# Patient Record
Sex: Female | Born: 1995 | Race: White | Hispanic: No | Marital: Single | Smoking: Current every day smoker
Health system: Southern US, Community
[De-identification: ages and names within clinical notes are randomized; demographics above are authoritative.]

## PROBLEM LIST (undated history)

## (undated) DIAGNOSIS — N83209 Unspecified ovarian cyst, unspecified side: Secondary | ICD-10-CM

## (undated) DIAGNOSIS — R5600 Simple febrile convulsions: Secondary | ICD-10-CM

## (undated) DIAGNOSIS — N200 Calculus of kidney: Secondary | ICD-10-CM

## (undated) DIAGNOSIS — F419 Anxiety disorder, unspecified: Secondary | ICD-10-CM

## (undated) DIAGNOSIS — F32A Depression, unspecified: Secondary | ICD-10-CM

## (undated) DIAGNOSIS — Z87442 Personal history of urinary calculi: Secondary | ICD-10-CM

## (undated) DIAGNOSIS — F329 Major depressive disorder, single episode, unspecified: Secondary | ICD-10-CM

## (undated) DIAGNOSIS — D649 Anemia, unspecified: Secondary | ICD-10-CM

## (undated) HISTORY — DX: Major depressive disorder, single episode, unspecified: F32.9

## (undated) HISTORY — PX: KNEE SURGERY: SHX244

## (undated) HISTORY — DX: Calculus of kidney: N20.0

## (undated) HISTORY — DX: Depression, unspecified: F32.A

---
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
## 2016-06-27 ENCOUNTER — Encounter: Payer: Self-pay | Admitting: Emergency Medicine

## 2016-06-27 ENCOUNTER — Emergency Department: Payer: BLUE CROSS/BLUE SHIELD

## 2016-06-27 ENCOUNTER — Emergency Department
Admission: EM | Admit: 2016-06-27 | Discharge: 2016-06-27 | Disposition: A | Discharge status: Home / Self Care | Payer: BLUE CROSS/BLUE SHIELD | Attending: Emergency Medicine | Admitting: Emergency Medicine

## 2016-06-27 DIAGNOSIS — N730 Acute parametritis and pelvic cellulitis: Secondary | ICD-10-CM | POA: Diagnosis not present

## 2016-06-27 DIAGNOSIS — A599 Trichomoniasis, unspecified: Secondary | ICD-10-CM

## 2016-06-27 DIAGNOSIS — A749 Chlamydial infection, unspecified: Secondary | ICD-10-CM | POA: Insufficient documentation

## 2016-06-27 DIAGNOSIS — R112 Nausea with vomiting, unspecified: Secondary | ICD-10-CM | POA: Diagnosis present

## 2016-06-27 DIAGNOSIS — R109 Unspecified abdominal pain: Secondary | ICD-10-CM

## 2016-06-27 LAB — COMPREHENSIVE METABOLIC PANEL
ALT: 17 U/L (ref 14–54)
AST: 22 U/L (ref 15–41)
Albumin: 4.1 g/dL (ref 3.5–5.0)
Alkaline Phosphatase: 94 U/L (ref 38–126)
Anion gap: 6 (ref 5–15)
BUN: 13 mg/dL (ref 6–20)
CO2: 28 mmol/L (ref 22–32)
Calcium: 9.3 mg/dL (ref 8.9–10.3)
Chloride: 106 mmol/L (ref 101–111)
Creatinine, Ser: 0.67 mg/dL (ref 0.44–1.00)
GFR calc Af Amer: >60 mL/min (ref >60)
GFR calc non Af Amer: >60 mL/min (ref >60)
Glucose, Bld: 110 mg/dL — ABNORMAL HIGH (ref 65–99)
Potassium: 4.3 mmol/L (ref 3.5–5.1)
Sodium: 140 mmol/L (ref 135–145)
Total Bilirubin: 0.2 mg/dL — ABNORMAL LOW (ref 0.3–1.2)
Total Protein: 7.3 g/dL (ref 6.5–8.1)

## 2016-06-27 LAB — URINALYSIS, COMPLETE (UACMP) WITH MICROSCOPIC
Bacteria, UA: NONE SEEN
Bilirubin Urine: NEGATIVE
Glucose, UA: NEGATIVE mg/dL
Hgb urine dipstick: NEGATIVE
Ketones, ur: NEGATIVE mg/dL
Nitrite: NEGATIVE
Protein, ur: NEGATIVE mg/dL
Specific Gravity, Urine: 1.021 (ref 1.005–1.030)
pH: 6 (ref 5.0–8.0)

## 2016-06-27 LAB — CBC
HCT: 38.9 % (ref 35.0–47.0)
Hemoglobin: 12.6 g/dL (ref 12.0–16.0)
MCH: 26.3 pg (ref 26.0–34.0)
MCHC: 32.3 g/dL (ref 32.0–36.0)
MCV: 81.3 fL (ref 80.0–100.0)
Platelets: 273 10*3/uL (ref 150–440)
RBC: 4.79 MIL/uL (ref 3.80–5.20)
RDW: 15.2 % — ABNORMAL HIGH (ref 11.5–14.5)
WBC: 11.8 10*3/uL — ABNORMAL HIGH (ref 3.6–11.0)

## 2016-06-27 LAB — WET PREP, GENITAL
Clue Cells Wet Prep HPF POC: NONE SEEN
Sperm: NONE SEEN
Yeast Wet Prep HPF POC: NONE SEEN

## 2016-06-27 LAB — CHLAMYDIA/NGC RT PCR (ARMC ONLY)
Chlamydia Tr: DETECTED — AB
N gonorrhoeae: NOT DETECTED

## 2016-06-27 LAB — POCT PREGNANCY, URINE: Preg Test, Ur: NEGATIVE

## 2016-06-27 LAB — LIPASE, BLOOD: Lipase: 46 U/L (ref 11–51)

## 2016-06-27 LAB — TROPONIN I: Troponin I: <0.03 ng/mL (ref <0.03)

## 2016-06-27 MED ORDER — ONDANSETRON HCL 4 MG/2ML IJ SOLN
4.0000 mg | Freq: Once | INTRAMUSCULAR | Status: AC
  Administered 2016-06-27: 4 mg via INTRAVENOUS
  Filled 2016-06-27: qty 2

## 2016-06-27 MED ORDER — IBUPROFEN 600 MG PO TABS
600.0000 mg | ORAL_TABLET | Freq: Once | ORAL | Status: AC
  Administered 2016-06-27: 600 mg via ORAL

## 2016-06-27 MED ORDER — DOXYCYCLINE HYCLATE 50 MG PO CAPS: 100.0000 mg | ORAL_CAPSULE | Freq: Two times a day (BID) | ORAL | 0 refills | Status: DC

## 2016-06-27 MED ORDER — SODIUM CHLORIDE 0.9 % IV BOLUS (SEPSIS)
1000.0000 mL | Freq: Once | INTRAVENOUS | Status: AC
  Administered 2016-06-27: 1000 mL via INTRAVENOUS

## 2016-06-27 MED ORDER — IOPAMIDOL (ISOVUE-300) INJECTION 61%
100.0000 mL | Freq: Once | INTRAVENOUS | Status: AC | PRN
  Administered 2016-06-27: 100 mL via INTRAVENOUS

## 2016-06-27 MED ORDER — METRONIDAZOLE 500 MG PO TABS: 500.0000 mg | ORAL_TABLET | Freq: Two times a day (BID) | ORAL | 0 refills | Status: DC

## 2016-06-27 MED ORDER — MORPHINE SULFATE (PF) 4 MG/ML IV SOLN
4.0000 mg | Freq: Once | INTRAVENOUS | Status: AC
  Administered 2016-06-27: 4 mg via INTRAVENOUS
  Filled 2016-06-27: qty 1

## 2016-06-27 MED ORDER — DOXYCYCLINE HYCLATE 100 MG PO TABS
100.0000 mg | ORAL_TABLET | Freq: Two times a day (BID) | ORAL | Status: DC
  Administered 2016-06-27: 100 mg via ORAL
  Filled 2016-06-27: qty 1

## 2016-06-27 MED ORDER — METRONIDAZOLE 500 MG PO TABS
500.0000 mg | ORAL_TABLET | Freq: Once | ORAL | Status: AC
  Administered 2016-06-27: 500 mg via ORAL
  Filled 2016-06-27: qty 1

## 2016-06-27 MED ORDER — IOPAMIDOL (ISOVUE-300) INJECTION 61%
30.0000 mL | Freq: Once | INTRAVENOUS | Status: AC | PRN
  Administered 2016-06-27: 30 mL via ORAL

## 2016-06-27 MED ORDER — IBUPROFEN 600 MG PO TABS
ORAL_TABLET | ORAL | Status: AC
  Administered 2016-06-27: 600 mg via ORAL
  Filled 2016-06-27: qty 1

## 2016-06-27 NOTE — ED Notes (Signed)
Patient transported to CT 

## 2016-06-27 NOTE — Discharge Instructions (Signed)
You were evaluated for abdominal pain and found to have STD called Chlamydia and STD called trichomonas and so I'm treating U for a longer period of time for what is called pelvic inflammatory disease.  Also found to have a cyst on your right ovary. Please follow-up with OB/GYN if you're having any continued symptoms.  Return to the emergency department immediately for any worsening abdominal pain, fever, vomiting and cannot keep her medications down, or any other symptoms concerning to you. Next I'm pleased or any sexual contacts to be tested and treated to prevent spread or re-exposure to STD.

## 2016-06-27 NOTE — ED Notes (Signed)
Pt awakened from sleep in lobby when name called standing right beside her; pt rates abd pain 7/10;

## 2016-06-27 NOTE — ED Provider Notes (Addendum)
[facility] Emergency Department Provider Note ____________________________________________   I have reviewed the triage vital signs and the triage nursing note.  HISTORY  Chief Complaint Abdominal Pain and Emesis   Historian Patient  HPI Vanessa Hester is a 21 y.o. female woke up around midnight with mid abdominal pain. She became nauseated and has vomited once in the emergency department waiting room, reports nonbloody nonbilious. Pain is been persistent and is somewhat crampy in nature. No diarrhea. Pain now is more so on the right side and right lower quadrant.  Pain is moderate to severe.  Mild vaginal discharge without vaginal or pelvic pain.    Past Medical History:  Diagnosis Date  . Depression   . Febrile seizure (HCC) 10/10/14   When she was an infany    Patient Active Problem List   Diagnosis Date Noted  . Anemia 02/05/2015  . Depression 02/05/2015  . Menorrhagia 02/04/2015  . Fatigue 02/04/2015  . Tension headache 02/04/2015  . IBS (irritable bowel syndrome) 02/04/2015  . Vitamin D deficiency 02/04/2015    Past Surgical History:  Procedure Laterality Date  . KNEE SURGERY Left   . 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     Prior to Admission medications   Medication Sig Start Date End Date Taking? Authorizing Provider  azithromycin (ZITHROMAX Z-PAK) 250 MG tablet Take 2 tablets (500 mg) on  Day 1,  followed by 1 tablet (250 mg) once daily on Days 2 through 5. 06/21/15   Ignacia Bayley, PA-C  ciprofloxacin (CIPRO) 500 MG tablet Take 1 tablet (500 mg total) by mouth 2 (two) times daily. 12/26/15   Jene Every, MD  cyclobenzaprine (FLEXERIL) 5 MG tablet Take 1 tablet (5 mg total) by mouth every 8 (eight) hours as needed for muscle spasms. 10/22/15 10/21/16  Charlynne Pander, MD  doxycycline (VIBRAMYCIN) 50 MG capsule Take 2 capsules (100 mg total) by mouth 2 (two) times daily. 06/27/16   Governor Rooks, MD   HYDROcodone-acetaminophen (NORCO) 5-325 MG tablet Take 1-2 tablets by mouth every 6 (six) hours as needed for moderate pain. 10/22/15   Charlynne Pander, MD  HYDROcodone-homatropine [facility]) 5-1.5 MG/5ML syrup Take 5 mLs by mouth every 6 (six) hours as needed for cough. 06/21/15   Ignacia Bayley, PA-C  Linaclotide (LINZESS) 145 MCG CAPS capsule Take 1 capsule (145 mcg total) by mouth daily. Failed Miralax 02/05/15   Gabriel Cirri, NP  metroNIDAZOLE (FLAGYL) 500 MG tablet Take 1 tablet (500 mg total) by mouth 2 (two) times daily. 06/27/16   Governor Rooks, MD  norgestimate-ethinyl estradiol (SPRINTEC 28) 0.25-35 MG-MCG tablet Take 1 tablet by mouth daily. 02/05/15   Gabriel Cirri, NP  ondansetron (ZOFRAN ODT) 4 MG disintegrating tablet Take 1 tablet (4 mg total) by mouth every 8 (eight) hours as needed for nausea or vomiting. 04/21/15   Jene Every, MD    Allergies  Allergen Reactions  . Other Shortness Of Breath    Medication is "Durahistamine" used for coughing when she was a child  . Gardasil 9 [Hpv 9-Valent Recomb Vaccine]   . Amoxicillin Hives    Family History  Problem Relation Age of Onset  . Migraines Father   . Hypertension Father   . Cancer Maternal Grandmother     colon and uterine  . Cancer Maternal Grandfather     prostate  . Arthritis Paternal Grandmother   . Diabetes Paternal Grandmother   . Asthma Paternal Grandfather   .  Depression Mother   . Cancer Maternal Aunt     breast  . Diabetes Maternal Uncle   . Mental illness Paternal Aunt     Social History Social History  Substance Use Topics  . Smoking status: Never Smoker  . Smokeless tobacco: Never Used  . Alcohol use No    Review of Systems  Constitutional: Negative for fever. Eyes: Negative for visual changes. ENT: Negative for sore throat. Cardiovascular: Negative for chest pain. Respiratory: Negative for shortness of breath. Gastrointestinal: Negative for Constipation or diarrhea. Genitourinary: Negative  for dysuria. Musculoskeletal: Negative for back pain. Skin: Negative for rash. Neurological: Negative for headache. 10 point Review of Systems otherwise negative ____________________________________________   PHYSICAL EXAM:  VITAL SIGNS: ED Triage Vitals  Enc Vitals Group     BP 06/27/16 0700 (!) 108/58     Pulse Rate 06/27/16 0700 (!) 106     Resp 06/27/16 0700 18     Temp 06/27/16 0700 97.7 F (36.5 C)     Temp Source 06/27/16 0700 Oral     SpO2 06/27/16 0700 98 %     Weight 06/27/16 0255 210 lb (95.3 kg)     Height 06/27/16 0255 5\' 5"  (1.651 m)     Head Circumference --      Peak Flow --      Pain Score 06/27/16 0256 8     Pain Loc --      Pain Edu? --      Excl. in GC? --      Constitutional: Alert and oriented. Well appearing and in no distress. HEENT   Head: Normocephalic and atraumatic.      Eyes: Conjunctivae are normal. PERRL. Normal extraocular movements.      Ears:         Nose: No congestion/rhinnorhea.   Mouth/Throat: Mucous membranes are moist.   Neck: No stridor. Cardiovascular/Chest: Normal rate, regular rhythm.  No murmurs, rubs, or gallops. Respiratory: Normal respiratory effort without tachypnea nor retractions. Breath sounds are clear and equal bilaterally. No wheezes/rales/rhonchi. Gastrointestinal: Soft. No distention, no guarding, no rebound.  Obese. Moderate tenderness epigastric and right upper quadrant and right lower quadrant.  Genitourinary/rectal:Fair amount of vaginal discharge, no cervicitis or specific adnexal or uterine tenderness to palpation. Musculoskeletal: Nontender with normal range of motion in all extremities. No joint effusions.  No lower extremity tenderness.  No edema. Neurologic:  Normal speech and language. No gross or focal neurologic deficits are appreciated. Skin:  Skin is warm, dry and intact. No rash noted. Psychiatric: Mood and affect are normal. Speech and behavior are normal. Patient exhibits appropriate  insight and judgment.   ____________________________________________  LABS (pertinent positives/negatives)  Labs Reviewed  CHLAMYDIA/NGC RT PCR ([facility] ONLY) - Abnormal; Notable for the following:       Result Value   Chlamydia Tr DETECTED (*)    All other components within normal limits  WET PREP, GENITAL - Abnormal; Notable for the following:    Trich, Wet Prep PRESENT (*)    WBC, Wet Prep HPF POC MODERATE (*)    All other components within normal limits  COMPREHENSIVE METABOLIC PANEL - Abnormal; Notable for the following:    Glucose, Bld 110 (*)    Total Bilirubin 0.2 (*)    All other components within normal limits  CBC - Abnormal; Notable for the following:    WBC 11.8 (*)    RDW 15.2 (*)    All other components within normal limits  URINALYSIS, COMPLETE (UACMP)  WITH MICROSCOPIC - Abnormal; Notable for the following:    Color, Urine YELLOW (*)    APPearance CLEAR (*)    Leukocytes, UA TRACE (*)    Squamous Epithelial / LPF 0-5 (*)    All other components within normal limits  LIPASE, BLOOD  TROPONIN I  POC URINE PREG, ED  POCT PREGNANCY, URINE    ____________________________________________    EKG I, Governor Rooks, MD, the attending physician have personally viewed and interpreted all ECGs.  81 bpm. Normal sinus rhythm. No distress. Normal axis. Normal ST and T-wave ____________________________________________  RADIOLOGY All Xrays were viewed by me. Imaging interpreted by Radiologist.  CT abdomen and pelvis with contrast:  IMPRESSION: Normal appendix.  4 cm right ovarian cyst.  Pelvic and transvaginal ultrasound: IMPRESSION: 3.5 cm complex cyst in the right ovary most compatible with hemorrhagic cyst.  No evidence of torsion. __________________________________________  PROCEDURES  Procedure(s) performed: None  Critical Care performed: None  ____________________________________________   ED COURSE / ASSESSMENT AND PLAN  Pertinent  labs & imaging results that were available during my care of the patient were reviewed by me and considered in my medical decision making (see chart for details).   Ms. Mangiapane is here with a complaint of abdominal pain, and initially was associated with nausea and vomiting and mid umbilical pain, now on exam seems more right sided including right lower quadrant. I discussed procedures benefit for CT scan and we chose to proceed.   CT scan is reassuring with no sign of appendicitis. She does have a right-sided ovarian cyst. After completing the pelvic exam, she did have a fair amount of discharge, and STD testing came back positive for Chlamydia and Trichomonas. I discussed this with her as well as treating and testing any sexual partners. Given the fact that her white blood cell count is little bit elevated and she has this cystic structure seen on the ovary, I did discuss with her obtaining an ultrasound to rule out tubo-ovarian abscess although I suspect this is extremely unlikely.  I'm going to go ahead and treat her for 10 days of doxycycline and metronidazole for PID.  Ultrasound negative for torsion or TOA. Patient to be discharged with my prepared discharge instructions.    CONSULTATIONS:   None  Patient / Family / Caregiver informed of clinical course, medical decision-making process, and agree with plan.   I discussed return precautions, follow-up instructions, and discharge instructions with patient and/or family.   ___________________________________________   FINAL CLINICAL IMPRESSION(S) / ED DIAGNOSES   Final diagnoses:  Nausea and vomiting, intractability of vomiting not specified, unspecified vomiting type  Right sided abdominal pain  PID (acute pelvic inflammatory disease)  Trichomonas infection  Chlamydia              Note: This dictation was prepared with Dragon dictation. Any transcriptional errors that result from this process are unintentional     Governor Rooks, MD 06/27/16 1517    Governor Rooks, MD 06/27/16 1525

## 2016-06-27 NOTE — ED Triage Notes (Signed)
Patient with complaint of lower abdominal pain that started around midnight. Patient states that vomited times two. Patient denies diarrhea.

## 2016-06-27 NOTE — ED Notes (Signed)
MD at bedside. 

## 2016-06-28 ENCOUNTER — Encounter (HOSPITAL_COMMUNITY): Payer: Self-pay | Admitting: Certified Nurse Midwife

## 2016-06-28 ENCOUNTER — Inpatient Hospital Stay (HOSPITAL_COMMUNITY)
Admission: AD | Admit: 2016-06-28 | Discharge: 2016-06-28 | Disposition: A | Discharge status: Home / Self Care | Payer: BLUE CROSS/BLUE SHIELD | Source: Ambulatory Visit | Attending: Family Medicine | Admitting: Family Medicine

## 2016-06-28 ENCOUNTER — Telehealth (HOSPITAL_COMMUNITY): Payer: Self-pay

## 2016-06-28 DIAGNOSIS — N83201 Unspecified ovarian cyst, right side: Secondary | ICD-10-CM

## 2016-06-28 DIAGNOSIS — N8301 Follicular cyst of right ovary: Secondary | ICD-10-CM

## 2016-06-28 DIAGNOSIS — A749 Chlamydial infection, unspecified: Secondary | ICD-10-CM

## 2016-06-28 DIAGNOSIS — A599 Trichomoniasis, unspecified: Secondary | ICD-10-CM

## 2016-06-28 DIAGNOSIS — R109 Unspecified abdominal pain: Secondary | ICD-10-CM | POA: Diagnosis present

## 2016-06-28 LAB — CBC
HCT: 36.7 % (ref 36.0–46.0)
Hemoglobin: 11.9 g/dL — ABNORMAL LOW (ref 12.0–15.0)
MCH: 26.5 pg (ref 26.0–34.0)
MCHC: 32.4 g/dL (ref 30.0–36.0)
MCV: 81.7 fL (ref 78.0–100.0)
Platelets: 239 10*3/uL (ref 150–400)
RBC: 4.49 MIL/uL (ref 3.87–5.11)
RDW: 15 % (ref 11.5–15.5)
WBC: 10.2 10*3/uL (ref 4.0–10.5)

## 2016-06-28 LAB — URINALYSIS, ROUTINE W REFLEX MICROSCOPIC
Bilirubin Urine: NEGATIVE
Glucose, UA: NEGATIVE mg/dL
Hgb urine dipstick: NEGATIVE
Ketones, ur: NEGATIVE mg/dL
Leukocytes, UA: NEGATIVE
Nitrite: NEGATIVE
Protein, ur: NEGATIVE mg/dL
Specific Gravity, Urine: 1.027 (ref 1.005–1.030)
pH: 5 (ref 5.0–8.0)

## 2016-06-28 LAB — COMPREHENSIVE METABOLIC PANEL
ALT: 16 U/L (ref 14–54)
AST: 19 U/L (ref 15–41)
Albumin: 3.4 g/dL — ABNORMAL LOW (ref 3.5–5.0)
Alkaline Phosphatase: 69 U/L (ref 38–126)
Anion gap: 5 (ref 5–15)
BUN: 11 mg/dL (ref 6–20)
CO2: 28 mmol/L (ref 22–32)
Calcium: 8.9 mg/dL (ref 8.9–10.3)
Chloride: 104 mmol/L (ref 101–111)
Creatinine, Ser: 0.72 mg/dL (ref 0.44–1.00)
GFR calc Af Amer: >60 mL/min (ref >60)
GFR calc non Af Amer: >60 mL/min (ref >60)
Glucose, Bld: 94 mg/dL (ref 65–99)
Potassium: 4.9 mmol/L (ref 3.5–5.1)
Sodium: 137 mmol/L (ref 135–145)
Total Bilirubin: 0.5 mg/dL (ref 0.3–1.2)
Total Protein: 6.1 g/dL — ABNORMAL LOW (ref 6.5–8.1)

## 2016-06-28 LAB — POCT PREGNANCY, URINE: Preg Test, Ur: NEGATIVE

## 2016-06-28 MED ORDER — OXYCODONE-ACETAMINOPHEN 5-325 MG PO TABS
2.0000 | ORAL_TABLET | Freq: Once | ORAL | Status: AC
  Administered 2016-06-28: 2 via ORAL
  Filled 2016-06-28: qty 2

## 2016-06-28 MED ORDER — OXYCODONE-ACETAMINOPHEN 5-325 MG PO TABS: 1.0000 | ORAL_TABLET | Freq: Four times a day (QID) | ORAL | 0 refills | Status: DC | PRN

## 2016-06-28 MED ORDER — PROMETHAZINE HCL 25 MG PO TABS: 12.5000 mg | ORAL_TABLET | Freq: Four times a day (QID) | ORAL | 0 refills | Status: DC | PRN

## 2016-06-28 MED ORDER — METRONIDAZOLE 500 MG PO TABS
2000.0000 mg | ORAL_TABLET | Freq: Once | ORAL | Status: AC
  Administered 2016-06-28: 2000 mg via ORAL
  Filled 2016-06-28: qty 4

## 2016-06-28 MED ORDER — AZITHROMYCIN 250 MG PO TABS
1000.0000 mg | ORAL_TABLET | Freq: Once | ORAL | Status: AC
  Administered 2016-06-28: 1000 mg via ORAL
  Filled 2016-06-28: qty 4

## 2016-06-28 MED ORDER — ONDANSETRON 8 MG PO TBDP
8.0000 mg | ORAL_TABLET | ORAL | Status: AC
  Administered 2016-06-28: 8 mg via ORAL
  Filled 2016-06-28: qty 1

## 2016-06-28 MED ORDER — KETOROLAC TROMETHAMINE 60 MG/2ML IM SOLN
60.0000 mg | Freq: Once | INTRAMUSCULAR | Status: AC
  Administered 2016-06-28: 60 mg via INTRAMUSCULAR
  Filled 2016-06-28: qty 2

## 2016-06-28 NOTE — Telephone Encounter (Signed)
Pt called asking about a rx that was given last night at her visit. Her Pharmacy told her she would have to have a hand written rx for the one med she was not able to pick up at pharmacy. Pt coming later tonight to pick up rx.

## 2016-06-28 NOTE — Discharge Instructions (Signed)
Expedited Partner Therapy:  °Information Sheet for Patients and Partners  °            ° °You have been offered expedited partner therapy (EPT). This information sheet contains important information and warnings you need to be aware of, so please read it carefully.  ° °Expedited Partner Therapy (EPT) is the clinical practice of treating the sexual partners of persons who receive chlamydia, gonorrhea, or trichomoniasis diagnoses by providing medications or prescriptions to the patient. Patients then provide partners with these therapies without the health-care provider having examined the partner. In other words, EPT is a convenient, fast and private way for patients to help their sexual partners get treated.  ° °Chlamydia and gonorrhea are bacterial infections you get from having sex with a person who is already infected. Trichomoniasis (or “trich”) is a very common sexually transmitted infection (STI) that is caused by infection with a protozoan parasite called Trichomonas vaginalis.  Many people with these infections don’t know it because they feel fine, but without treatment these infections can cause serious health problems, such as pelvic inflammatory disease, ectopic pregnancy, infertility and increased risk of HIV.  ° °It is important to get treated as soon as possible to protect your health, to avoid spreading these infections to others, and to prevent yourself from becoming re-infected. The good news is these infections can be easily cured with proper antibiotic medicine. The best way to take care of your self is to see a doctor or go to your local health department. If you are not able to see a doctor or other medical provider, you should take EPT.  ° ° °Recommended Medication: °EPT for Chlamydia:  Azithromycin (Zithromax) 1 gram orally in a single dose °EPT for Gonorrhea:  Cefixime (Suprax) 400 milligrams orally in a single dose PLUS azithromycin (Zithromax) 1 gram orally in a single dose °EPT for  Trichomoniasis:  Metronidazole (Flagyl) 2 grams orally in a single dose ° ° °These medicines are very safe. However, you should not take them if you have ever had an allergic reaction (like a rash) to any of these medicines: azithromycin (Zithromax), erythromycin, clarithromycin (Biaxin), metronidazole (Flagyl), tinidazole (Tindimax). If you are uncertain about whether you have an allergy, call your medical provider or pharmacist before taking this medicine. If you have a serious, long-term illness like kidney, liver or heart disease, colitis or stomach problems, or you are currently taking other prescription medication, talk to your provider before taking this medication.  ° °Women: If you have lower belly pain, pain during sex, vomiting, or a fever, do not take this medicine. Instead, you should see a medical provider to be certain you do not have pelvic inflammatory disease (PID). PID can be serious and lead to infertility, pregnancy problems or chronic pelvic pain.  ° °Pregnant Women: It is very important for you to see a doctor to get pregnancy services and pre-natal care. These antibiotics for EPT are safe for pregnant women, but you still need to see a medical provider as soon as possible. It is also important to note that Doxycycline is an alternative therapy for chlamydia, but it should not be taken by someone who is pregnant.  ° °Men: If you have pain or swelling in the testicles or a fever, do not take this medicine and see a medical provider.    ° °Men who have sex with men (MSM): MSM in [place] continue to experience high rates of syphilis and HIV. Many MSM with gonorrhea or   chlamydia could also have syphilis and/or HIV and not know it. If you are a man who has sex with other men, it is very important that you see a medical provider and are tested for HIV and syphilis. EPT is not recommended for gonorrhea for MSM.  Recommended treatment for gonorrhea for MSM is Rocephin (shot) AND azithromycin  due to decreased cure rate.  Please see your medical provider if this is the case.   ° °Along with this information sheet is a prescription for the medicine. If you receive a prescription it will be in your name and will indicate your date of birth, or it will be in the name of “Expedited Partner Therapy”.   In either case, you can have the prescription filled at a pharmacy. You will be responsible for the cost of the medicine, unless you have prescription drug coverage. In that case, you could provide your name so the pharmacy could bill your health plan.  ° °Take the medication as directed. Some people will have a mild, upset stomach, which does not last long. AVOID alcohol 24 hours after taking metronidazole (Flagyl) to reduce the possibility of a disulfiram-like reaction (severe vomiting and abdominal pain).  After taking the medicine, do not have sex for 7 days. Do not share this medicine or give it to anyone else. It is important to tell everyone you have had sex with in the last 60 days that they need to go and get tested for sexually transmitted infections.  ° °Ways to prevent these and other sexually transmitted infections (STIs):  ° °• Abstain from sex. This is the only sure way to avoid getting an STI.  °• Use barrier methods, such as condoms, consistently and correctly.  °• Limit the number of sexual partners.  °• Have regular physical exams, including testing for STIs.  ° °For more information about EPT or other issues pertaining to an STI, please contact your medical provider or the [place] [facility] at [phone] or [email or web address]   ° °

## 2016-06-28 NOTE — MAU Note (Signed)
Pt was seen at [facility] last night-told she has a hemorrhagic cyst on right ovary. States that she has continued to have nausea and abdominal pain. Was given 800mg  ibuprofen around 300pm. Took Tylenol around 8pm. Has not helped. Rates 10/10.

## 2016-06-28 NOTE — MAU Provider Note (Signed)
Chief Complaint: Abdominal Pain and Nausea   None     SUBJECTIVE HPI: Vanessa Hester is a 21 y.o. G0 who presents to maternity admissions reporting abdominal pain and nausea x 3 days.  This is a new symptom and the pt was first evaluated yesterday at [facility], where she was diagnosed with hemorrhagic ovarian cyst on the right.  She was given morphine in the ED but reports it made her more nauseous and the pain returned. She has tried ibuprofen and tylenol and a heating pad at home but the pain has continued. It has not improved since yesterday but has not worsened either.  It is constant low abdominal pain, mostly on the right side but also low in the middle that is burning and stabbing pain.  It is associated with nausea but no vomiting.  She also reports vaginal spotting. She had PCR testing for chlamydia that was positive yesterday and wet prep was positive for trichomonas.  She was prescribed doxycycline and Flagyl for these infections. She denies vaginal bleeding, vaginal itching/burning, urinary symptoms, h/a, dizziness, n/v, or fever/chills.     HPI  Past Medical History:  Diagnosis Date  . Depression   . Febrile seizure (HCC) 10/10/14   When she was an infany   Past Surgical History:  Procedure Laterality Date  . KNEE SURGERY Left   . 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    Social History   Social History  . Marital status: Single    Spouse name: N/A  . Number of children: N/A  . Years of education: N/A   Occupational History  . Not on file.   Social History Main Topics  . Smoking status: Never Smoker  . Smokeless tobacco: Never Used  . Alcohol use No  . Drug use: No  . Sexual activity: Not on file   Other Topics Concern  . Not on file   Social History Narrative  . No narrative on file   No current facility-administered medications on file prior to encounter.    Current Outpatient Prescriptions on File Prior to Encounter   Medication Sig Dispense Refill  . Linaclotide (LINZESS) 145 MCG CAPS capsule Take 1 capsule (145 mcg total) by mouth daily. Failed Miralax 30 capsule 11   Allergies  Allergen Reactions  . Other Shortness Of Breath    Medication is "Durahistamine" used for coughing when she was a child  . Gardasil 9 [Hpv 9-Valent Recomb Vaccine]   . Amoxicillin Hives    ROS:  Review of Systems  Constitutional: Negative for chills, fatigue and fever.  Respiratory: Negative for shortness of breath.   Cardiovascular: Negative for chest pain.  Gastrointestinal: Positive for abdominal pain.  Genitourinary: Positive for pelvic pain and vaginal bleeding. Negative for difficulty urinating, dysuria, flank pain, vaginal discharge and vaginal pain.  Neurological: Negative for dizziness and headaches.  Psychiatric/Behavioral: Negative.      I have reviewed patient's Past Medical Hx, Surgical Hx, Family Hx, Social Hx, medications and allergies.   Physical Exam   Patient Vitals for the past 24 hrs:  BP Temp Temp src Pulse Resp SpO2  06/28/16 0257 130/75 98 F (36.7 C) Oral 74 16 -  06/28/16 0044 122/70 97.8 F (36.6 C) Oral 76 18 100 %   Constitutional: Well-developed, well-nourished female in mild distress.  Cardiovascular: normal rate Respiratory: normal effort GI: Abd soft, non-tender. Pos BS x 4 MS: Extremities nontender, no edema, normal ROM Neurologic: Alert and  oriented x 4.  GU: Neg CVAT.  PELVIC EXAM: Deferred, done yesterday in ED   LAB RESULTS Results for orders placed or performed during the hospital encounter of 06/28/16 (from the past 24 hour(s))  Urinalysis, Routine w reflex microscopic     Status: None   Collection Time: 06/28/16 12:26 AM  Result Value Ref Range   Color, Urine YELLOW YELLOW   APPearance CLEAR CLEAR   Specific Gravity, Urine 1.027 1.005 - 1.030   pH 5.0 5.0 - 8.0   Glucose, UA NEGATIVE NEGATIVE mg/dL   Hgb urine dipstick NEGATIVE NEGATIVE   Bilirubin Urine  NEGATIVE NEGATIVE   Ketones, ur NEGATIVE NEGATIVE mg/dL   Protein, ur NEGATIVE NEGATIVE mg/dL   Nitrite NEGATIVE NEGATIVE   Leukocytes, UA NEGATIVE NEGATIVE  CBC     Status: Abnormal   Collection Time: 06/28/16  1:33 AM  Result Value Ref Range   WBC 10.2 4.0 - 10.5 K/uL   RBC 4.49 3.87 - 5.11 MIL/uL   Hemoglobin 11.9 (L) 12.0 - 15.0 g/dL   HCT 16.1 09.6 - 04.5 %   MCV 81.7 78.0 - 100.0 fL   MCH 26.5 26.0 - 34.0 pg   MCHC 32.4 30.0 - 36.0 g/dL   RDW 40.9 81.1 - 91.4 %   Platelets 239 150 - 400 K/uL  Comprehensive metabolic panel     Status: Abnormal   Collection Time: 06/28/16  1:33 AM  Result Value Ref Range   Sodium 137 135 - 145 mmol/L   Potassium 4.9 3.5 - 5.1 mmol/L   Chloride 104 101 - 111 mmol/L   CO2 28 22 - 32 mmol/L   Glucose, Bld 94 65 - 99 mg/dL   BUN 11 6 - 20 mg/dL   Creatinine, Ser 7.82 0.44 - 1.00 mg/dL   Calcium 8.9 8.9 - 95.6 mg/dL   Total Protein 6.1 (L) 6.5 - 8.1 g/dL   Albumin 3.4 (L) 3.5 - 5.0 g/dL   AST 19 15 - 41 U/L   ALT 16 14 - 54 U/L   Alkaline Phosphatase 69 38 - 126 U/L   Total Bilirubin 0.5 0.3 - 1.2 mg/dL   GFR calc non Af Amer >60 >60 mL/min   GFR calc Af Amer >60 >60 mL/min   Anion gap 5 5 - 15  Pregnancy, urine POC     Status: None   Collection Time: 06/28/16  1:48 AM  Result Value Ref Range   Preg Test, Ur NEGATIVE NEGATIVE       IMAGING US Transvaginal Non-ob  Result Date: 06/27/2016 CLINICAL DATA:  Right lower quadrant pain.  Right ovarian cyst on CT EXAM: TRANSABDOMINAL AND TRANSVAGINAL ULTRASOUND OF PELVIS DOPPLER ULTRASOUND OF OVARIES TECHNIQUE: Both transabdominal and transvaginal ultrasound examinations of the pelvis were performed. Transabdominal technique was performed for global imaging of the pelvis including uterus, ovaries, adnexal regions, and pelvic cul-de-sac. It was necessary to proceed with endovaginal exam following the transabdominal exam to visualize the endometrium and ovaries. Color and duplex Doppler ultrasound  was utilized to evaluate blood flow to the ovaries. COMPARISON:  CT 06/27/2016 FINDINGS: Uterus Measurements: 6.6 x 3.3 x 4.3 cm. No fibroids or other mass visualized. Endometrium Thickness: 11 mm in thickness.  No focal abnormality visualized. Right ovary Measurements: 4.6 x 3.6 x 3.7 cm. 3.5 x 3.0 x 2.7 cm complex cyst with internal echoes, likely hemorrhagic cyst. Left ovary Measurements: 3.4 x 2.2 x 2.8 cm. Normal appearance/no adnexal mass. Pulsed Doppler evaluation of both ovaries demonstrates  normal low-resistance arterial and venous waveforms. Other findings Trace free fluid IMPRESSION: 3.5 cm complex cyst in the right ovary most compatible with hemorrhagic cyst. No evidence of torsion. Electronically Signed   By: Charlett Nose M.D.   On: 06/27/2016 15:19   US Pelvis Complete  Result Date: 06/27/2016 CLINICAL DATA:  Right lower quadrant pain.  Right ovarian cyst on CT EXAM: TRANSABDOMINAL AND TRANSVAGINAL ULTRASOUND OF PELVIS DOPPLER ULTRASOUND OF OVARIES TECHNIQUE: Both transabdominal and transvaginal ultrasound examinations of the pelvis were performed. Transabdominal technique was performed for global imaging of the pelvis including uterus, ovaries, adnexal regions, and pelvic cul-de-sac. It was necessary to proceed with endovaginal exam following the transabdominal exam to visualize the endometrium and ovaries. Color and duplex Doppler ultrasound was utilized to evaluate blood flow to the ovaries. COMPARISON:  CT 06/27/2016 FINDINGS: Uterus Measurements: 6.6 x 3.3 x 4.3 cm. No fibroids or other mass visualized. Endometrium Thickness: 11 mm in thickness.  No focal abnormality visualized. Right ovary Measurements: 4.6 x 3.6 x 3.7 cm. 3.5 x 3.0 x 2.7 cm complex cyst with internal echoes, likely hemorrhagic cyst. Left ovary Measurements: 3.4 x 2.2 x 2.8 cm. Normal appearance/no adnexal mass. Pulsed Doppler evaluation of both ovaries demonstrates normal low-resistance arterial and venous waveforms. Other  findings Trace free fluid IMPRESSION: 3.5 cm complex cyst in the right ovary most compatible with hemorrhagic cyst. No evidence of torsion. Electronically Signed   By: Charlett Nose M.D.   On: 06/27/2016 15:19   Ct Abdomen Pelvis W Contrast  Result Date: 06/27/2016 CLINICAL DATA:  Right lower quadrant pain. EXAM: CT ABDOMEN AND PELVIS WITH CONTRAST TECHNIQUE: Multidetector CT imaging of the abdomen and pelvis was performed using the standard protocol following bolus administration of intravenous contrast. CONTRAST:  ISOVUE-300 IOPAMIDOL (ISOVUE-300) INJECTION 61%, 30mL ISOVUE-300 IOPAMIDOL (ISOVUE-300) INJECTION 61% COMPARISON:  10/22/2015 FINDINGS: Lower chest: Lung bases are clear. No effusions. Heart is normal size. Hepatobiliary: No focal hepatic abnormality. Gallbladder unremarkable. Pancreas: No focal abnormality or ductal dilatation. Spleen: No focal abnormality.  Normal size. 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 No renal or ureteral stone. No hydronephrosis. Adrenal glands and urinary bladder are unremarkable. Stomach/Bowel: Normal appendix. Stomach, large and small bowel grossly unremarkable. Vascular/Lymphatic: No evidence of aneurysm or adenopathy. Reproductive: 4 cm cyst in the right ovary. Uterus and left ovary unremarkable. Other: No free fluid or free air. Musculoskeletal: No acute bony abnormality. IMPRESSION: Normal appendix. 4 cm right ovarian cyst. Electronically Signed   By: Charlett Nose M.D.   On: 06/27/2016 11:59   [place] Art/ven Flow Abd Pelv Doppler  Result Date: 06/27/2016 CLINICAL DATA:  Right lower quadrant pain.  Right ovarian cyst on CT EXAM: TRANSABDOMINAL AND TRANSVAGINAL ULTRASOUND OF PELVIS DOPPLER ULTRASOUND OF OVARIES TECHNIQUE: Both transabdominal and transvaginal ultrasound examinations of the pelvis were performed. Transabdominal technique was performed for global imaging of the pelvis including uterus, ovaries,  adnexal regions, and pelvic cul-de-sac. It was necessary to proceed with endovaginal exam following the transabdominal exam to visualize the endometrium and ovaries. Color and duplex Doppler ultrasound was utilized to evaluate blood flow to the ovaries. COMPARISON:  CT 06/27/2016 FINDINGS: Uterus Measurements: 6.6 x 3.3 x 4.3 cm. No fibroids or other mass visualized. Endometrium Thickness: 11 mm in thickness.  No focal abnormality visualized. Right ovary Measurements: 4.6 x 3.6 x 3.7 cm. 3.5 x 3.0 x 2.7 cm complex cyst with internal echoes, likely hemorrhagic cyst. Left ovary Measurements: 3.4 x  2.2 x 2.8 cm. Normal appearance/no adnexal mass. Pulsed Doppler evaluation of both ovaries demonstrates normal low-resistance arterial and venous waveforms. Other findings Trace free fluid IMPRESSION: 3.5 cm complex cyst in the right ovary most compatible with hemorrhagic cyst. No evidence of torsion. Electronically Signed   By: Charlett Nose M.D.   On: 06/27/2016 15:19    MAU Management/MDM: Reviewed results from labs and imaging yesterday.  Repeat CBC/CMP today.  Will treat pain with Toradol 60 mg IM x 1 and Percocet 5/325 x 2 tabs in MAU.  Pt able to sleep and reports significantly reduced pain after medications.  Pt was prescribed 10 days of doxycycline and 7 days of Flagyl for chlamydia and trichomonas dx made yesterday.  She has not picked up those medications so single dose therapy with azithromycin 1000 mg and Flagyl 2 g given today in MAU for improved compliance.  Cancelled pt prescriptions for the other abx.  Rx for Percocet 5/325, take 1-2 Q 6 hours PRN x 10 tabs.  F/U at [facility], a convenient location for pt.  Pt stable at time of discharge.  ASSESSMENT 1. Hemorrhagic cyst of right ovary   2. Chlamydia   3. Trichomonas infection     PLAN Discharge home  Allergies as of 06/28/2016      Reactions   Other Shortness Of Breath   Medication is "Durahistamine" used for coughing when she was a child    Gardasil 9 [hpv 9-valent Recomb Vaccine]    Amoxicillin Hives      Medication List    STOP taking these medications   azithromycin 250 MG tablet Commonly known as:  ZITHROMAX Z-PAK   ciprofloxacin 500 MG tablet Commonly known as:  CIPRO   cyclobenzaprine 5 MG tablet Commonly known as:  FLEXERIL   doxycycline 50 MG capsule Commonly known as:  VIBRAMYCIN   HYDROcodone-acetaminophen 5-325 MG tablet Commonly known as:  NORCO   HYDROcodone-homatropine 5-1.5 MG/5ML syrup Commonly known as:  HYCODAN   metroNIDAZOLE 500 MG tablet Commonly known as:  FLAGYL   norgestimate-ethinyl estradiol 0.25-35 MG-MCG tablet Commonly known as:  SPRINTEC 28   ondansetron 4 MG disintegrating tablet Commonly known as:  ZOFRAN ODT     TAKE these medications   linaclotide 145 MCG Caps capsule Commonly known as:  LINZESS Take 1 capsule (145 mcg total) by mouth daily. Failed Miralax   oxyCODONE-acetaminophen 5-325 MG tablet Commonly known as:  PERCOCET/ROXICET Take 1-2 tablets by mouth every 6 (six) hours as needed.   promethazine 25 MG tablet Commonly known as:  PHENERGAN Take 0.5-1 tablets (12.5-25 mg total) by mouth every 6 (six) hours as needed for nausea.      Follow-up Information    [facility] Follow up.   Specialty:  Obstetrics and Gynecology Why:  The office will call you or call to make an appointment this week. Return to MAU as needed for emergencies. Contact information: [address] [place] [address] [phone]          Sharen Counter Certified Nurse-Midwife 06/28/2016  4:36 AM

## 2016-06-29 ENCOUNTER — Telehealth: Payer: Self-pay | Admitting: Radiology

## 2016-06-29 NOTE — Telephone Encounter (Signed)
Call to schedule appointment to f/u on cyst and birth control request,tried several times to contact pt via cell phone, but had no success. Recording states that the pt is not available at this time.

## 2016-06-30 ENCOUNTER — Telehealth: Payer: Self-pay | Admitting: Radiology

## 2016-06-30 NOTE — Telephone Encounter (Signed)
Called patient to schedule appointment and again the phone rang once and then same recording that the patient was unavailable at this time. So unable to contact patient or leave a message

## 2016-07-19 ENCOUNTER — Emergency Department
Admission: EM | Admit: 2016-07-19 | Discharge: 2016-07-19 | Disposition: A | Discharge status: Home / Self Care | Payer: BLUE CROSS/BLUE SHIELD | Attending: Emergency Medicine | Admitting: Emergency Medicine

## 2016-07-19 ENCOUNTER — Encounter: Payer: Self-pay | Admitting: Emergency Medicine

## 2016-07-19 DIAGNOSIS — R1032 Left lower quadrant pain: Secondary | ICD-10-CM | POA: Diagnosis not present

## 2016-07-19 DIAGNOSIS — Z5321 Procedure and treatment not carried out due to patient leaving prior to being seen by health care provider: Secondary | ICD-10-CM | POA: Insufficient documentation

## 2016-07-19 HISTORY — DX: Unspecified ovarian cyst, unspecified side: N83.209

## 2016-07-19 LAB — COMPREHENSIVE METABOLIC PANEL
ALT: 13 U/L — ABNORMAL LOW (ref 14–54)
AST: 21 U/L (ref 15–41)
Albumin: 4 g/dL (ref 3.5–5.0)
Alkaline Phosphatase: 79 U/L (ref 38–126)
Anion gap: 6 (ref 5–15)
BUN: 12 mg/dL (ref 6–20)
CO2: 26 mmol/L (ref 22–32)
Calcium: 9.2 mg/dL (ref 8.9–10.3)
Chloride: 106 mmol/L (ref 101–111)
Creatinine, Ser: 0.69 mg/dL (ref 0.44–1.00)
GFR calc Af Amer: >60 mL/min (ref >60)
GFR calc non Af Amer: >60 mL/min (ref >60)
Glucose, Bld: 113 mg/dL — ABNORMAL HIGH (ref 65–99)
Potassium: 3.6 mmol/L (ref 3.5–5.1)
Sodium: 138 mmol/L (ref 135–145)
Total Bilirubin: 0.4 mg/dL (ref 0.3–1.2)
Total Protein: 7 g/dL (ref 6.5–8.1)

## 2016-07-19 LAB — CBC
HCT: 38.7 % (ref 35.0–47.0)
Hemoglobin: 12.8 g/dL (ref 12.0–16.0)
MCH: 26.6 pg (ref 26.0–34.0)
MCHC: 33.1 g/dL (ref 32.0–36.0)
MCV: 80.4 fL (ref 80.0–100.0)
Platelets: 288 10*3/uL (ref 150–440)
RBC: 4.81 MIL/uL (ref 3.80–5.20)
RDW: 15.5 % — ABNORMAL HIGH (ref 11.5–14.5)
WBC: 9.2 10*3/uL (ref 3.6–11.0)

## 2016-07-19 LAB — LIPASE, BLOOD: Lipase: 26 U/L (ref 11–51)

## 2016-07-19 NOTE — ED Triage Notes (Signed)
Pt presents to ED with c/o sharp left lower abd pain. Similar symptoms about 3 weeks ago and pt was dx with STD and a "bleeding ovarian cyst". Pt states pain had improved but returned about 30 min ago. +nausea. Painful with palpation.

## 2016-07-21 ENCOUNTER — Encounter: Payer: Self-pay | Admitting: Family Medicine

## 2016-07-21 ENCOUNTER — Ambulatory Visit (INDEPENDENT_AMBULATORY_CARE_PROVIDER_SITE_OTHER): Payer: BLUE CROSS/BLUE SHIELD | Admitting: Family Medicine

## 2016-07-21 VITALS — BP 110/72 | HR 76 | Resp 18 | Ht 65.0 in | Wt 227.0 lb

## 2016-07-21 DIAGNOSIS — N83201 Unspecified ovarian cyst, right side: Secondary | ICD-10-CM

## 2016-07-21 DIAGNOSIS — Z3009 Encounter for other general counseling and advice on contraception: Secondary | ICD-10-CM | POA: Diagnosis not present

## 2016-07-21 DIAGNOSIS — Z113 Encounter for screening for infections with a predominantly sexual mode of transmission: Secondary | ICD-10-CM

## 2016-07-21 NOTE — Progress Notes (Signed)
Pt here today to follow-up right ovarian cyst, still experiencing pain on and off and at times it is severe.  Would also like to discuss different birth control options.

## 2016-07-21 NOTE — Progress Notes (Signed)
   Subjective:    Patient ID: Vanessa Hester is a 21 y.o. female presenting with Follow-up  on 07/21/2016  HPI: Here for ED f/u. Presented early February with right hemorrhagic ovarian cyst. Pain persists and is worsening. First noted pain on right but now it is on the left. LMP was 2/14. Cycles are irregular since Depo and now off x 1.5 years. Also noted to have Chlamydia and Trich--treated. Interested in contraception.  Review of Systems  Constitutional: Negative for chills and fever.  Respiratory: Negative for shortness of breath.   Cardiovascular: Negative for chest pain.  Gastrointestinal: Negative for abdominal pain, nausea and vomiting.  Genitourinary: Negative for dysuria.  Skin: Negative for rash.      Objective:    BP 110/72 (BP Location: Left Arm, Patient Position: Sitting, Cuff Size: Large)   Pulse 76   Resp 18   Ht 5\' 5"  (1.651 m)   Wt 227 lb (103 kg)   LMP 06/30/2016   BMI 37.77 kg/m  Physical Exam  Constitutional: She is oriented to person, place, and time. She appears well-developed and well-nourished. No distress.  HENT:  Head: Normocephalic and atraumatic.  Eyes: No scleral icterus.  Neck: Neck supple.  Cardiovascular: Normal rate.   Pulmonary/Chest: Effort normal.  Abdominal: Soft.  Neurological: She is alert and oriented to person, place, and time.  Skin: Skin is warm and dry.  Psychiatric: She has a normal mood and affect.        Assessment & Plan:   Problem List Items Addressed This Visit      Unprioritized   Ovarian cyst    Likely from ovulation, no f/u necessary--Nexplanon should help with this.       Other Visit Diagnoses    Screen for STD (sexually transmitted disease)    -  Primary   Relevant Orders   HIV antibody (Completed)   RPR (Completed)   Encounter for counseling regarding contraception       desires Nexplanon-2-3 wks from LMP and has had unprotected intercourse--return with no unprotected intercourse x 2 wks and will  place with neg. UPT.      Total face-to-face time with patient: 20 minutes. Over 50% of encounter was spent on counseling and coordination of care. Return in about 3 months (around 10/21/2016).  Reva Boresanya S Jaymari Cromie 07/21/2016 2:22 PM

## 2016-07-21 NOTE — Patient Instructions (Addendum)
Safe Sex Practicing safe sex means taking steps before and during sex to reduce your risk of:  Getting an STD (sexually transmitted disease).  Giving your partner an STD.  Unwanted pregnancy. How can I practice safe sex?   To practice safe sex:  Limit your sexual partners to only one partner who is having sex with only you.  Avoid using alcohol and recreational drugs before having sex. These substances can affect your judgment.  Before having sex with a new partner:  Talk to your partner about past partners, past STDs, and drug use.  You and your partner should be screened for STDs and discuss the results with each other.  Check your body regularly for sores, blisters, rashes, or unusual discharge. If you notice any of these problems, visit your health care provider.  If you have symptoms of an infection or you are being treated for an STD, avoid sexual contact.  While having sex, use a condom. Make sure to:  Use a condom every time you have vaginal, oral, or anal sex. Both females and males should wear condoms during oral sex.  Keep condoms in place from the beginning to the end of sexual activity.  Use a latex condom, if possible. Latex condoms offer the best protection.  Use only water-based lubricants or oils to lubricate a condom. Using petroleum-based lubricants or oils will weaken the condom and increase the chance that it will break.  See your health care provider for regular screenings, exams, and tests for STDs.  Talk with your health care provider about the form of birth control (contraception) that is best for you.  Get vaccinated against hepatitis B and human papillomavirus (HPV).  If you are at risk of being infected with HIV (human immunodeficiency virus), talk with your health care provider about taking a prescription medicine to prevent HIV infection. You are considered at risk for HIV if:  You are a man who has sex with other men.  You are a  heterosexual man or woman who is sexually active with more than one partner.  You take drugs by injection.  You are sexually active with a partner who has HIV. This information is not intended to replace advice given to you by your health care provider. Make sure you discuss any questions you have with your health care provider. Document Released: 06/10/2004 Document Revised: 09/17/2015 Document Reviewed: 03/23/2015 Elsevier Interactive Patient Education  2017 [employer]. Etonogestrel implant What is this medicine? ETONOGESTREL (et oh noe JES trel) is a contraceptive (birth control) device. It is used to prevent pregnancy. It can be used for up to 3 years. This medicine may be used for other purposes; ask your health care provider or pharmacist if you have questions. COMMON BRAND NAME(S): Implanon, Nexplanon What should I tell my health care provider before I take this medicine? They need to know if you have any of these conditions: -abnormal vaginal bleeding -blood vessel disease or blood clots -cancer of the breast, cervix, or liver -depression -diabetes -gallbladder disease -headaches -heart disease or recent heart attack -high blood pressure -high cholesterol -kidney disease -liver disease -renal disease -seizures -tobacco smoker -an unusual or allergic reaction to etonogestrel, other hormones, anesthetics or antiseptics, medicines, foods, dyes, or preservatives -pregnant or trying to get pregnant -breast-feeding How should I use this medicine? This device is inserted just under the skin on the inner side of your upper arm by a health care professional. Talk to your pediatrician regarding the use of this  medicine in children. Special care may be needed. Overdosage: If you think you have taken too much of this medicine contact a poison control center or emergency room at once. NOTE: This medicine is only for you. Do not share this medicine with others. What if I miss a  dose? This does not apply. What may interact with this medicine? Do not take this medicine with any of the following medications: -amprenavir -bosentan -fosamprenavir This medicine may also interact with the following medications: -barbiturate medicines for inducing sleep or treating seizures -certain medicines for fungal infections like ketoconazole and itraconazole -grapefruit juice -griseofulvin -medicines to treat seizures like carbamazepine, felbamate, oxcarbazepine, phenytoin, topiramate -modafinil -phenylbutazone -rifampin -rufinamide -some medicines to treat HIV infection like atazanavir, indinavir, lopinavir, nelfinavir, tipranavir, ritonavir -St. John's wort This list may not describe all possible interactions. Give your health care provider a list of all the medicines, herbs, non-prescription drugs, or dietary supplements you use. Also tell them if you smoke, drink alcohol, or use illegal drugs. Some items may interact with your medicine. What should I watch for while using this medicine? This product does not protect you against HIV infection (AIDS) or other sexually transmitted diseases. You should be able to feel the implant by pressing your fingertips over the skin where it was inserted. Contact your doctor if you cannot feel the implant, and use a non-hormonal birth control method (such as condoms) until your doctor confirms that the implant is in place. If you feel that the implant may have broken or become bent while in your arm, contact your healthcare provider. What side effects may I notice from receiving this medicine? Side effects that you should report to your doctor or health care professional as soon as possible: -allergic reactions like skin rash, itching or hives, swelling of the face, lips, or tongue -breast lumps -changes in emotions or moods -depressed mood -heavy or prolonged menstrual bleeding -pain, irritation, swelling, or bruising at the insertion  site -scar at site of insertion -signs of infection at the insertion site such as fever, and skin redness, pain or discharge -signs of pregnancy -signs and symptoms of a blood clot such as breathing problems; changes in vision; chest pain; severe, sudden headache; pain, swelling, warmth in the leg; trouble speaking; sudden numbness or weakness of the face, arm or leg -signs and symptoms of liver injury like dark yellow or brown urine; general ill feeling or flu-like symptoms; light-colored stools; loss of appetite; nausea; right upper belly pain; unusually weak or tired; yellowing of the eyes or skin -unusual vaginal bleeding, discharge -signs and symptoms of a stroke like changes in vision; confusion; trouble speaking or understanding; severe headaches; sudden numbness or weakness of the face, arm or leg; trouble walking; dizziness; loss of balance or coordination Side effects that usually do not require medical attention (report to your doctor or health care professional if they continue or are bothersome): -acne -back pain -breast pain -changes in weight -dizziness -general ill feeling or flu-like symptoms -headache -irregular menstrual bleeding -nausea -sore throat -vaginal irritation or inflammation This list may not describe all possible side effects. Call your doctor for medical advice about side effects. You may report side effects to FDA at 1-800-FDA-1088. Where should I keep my medicine? This drug is given in a hospital or clinic and will not be stored at home. NOTE: This sheet is a summary. It may not cover all possible information. If you have questions about this medicine, talk to your doctor, pharmacist, or  health care provider.  2018 Elsevier/Gold Standard (2015-11-20 11:19:22)

## 2016-07-22 DIAGNOSIS — N83209 Unspecified ovarian cyst, unspecified side: Secondary | ICD-10-CM | POA: Insufficient documentation

## 2016-07-22 LAB — RPR: RPR Ser Ql: NONREACTIVE

## 2016-07-22 LAB — HIV ANTIBODY (ROUTINE TESTING W REFLEX): HIV Screen 4th Generation wRfx: NONREACTIVE

## 2016-07-22 NOTE — Assessment & Plan Note (Signed)
Likely from ovulation, no f/u necessary--Nexplanon should help with this.

## 2016-08-04 ENCOUNTER — Ambulatory Visit: Payer: BLUE CROSS/BLUE SHIELD | Admitting: Obstetrics & Gynecology

## 2016-08-04 DIAGNOSIS — Z30017 Encounter for initial prescription of implantable subdermal contraceptive: Secondary | ICD-10-CM

## 2016-09-09 ENCOUNTER — Ambulatory Visit (INDEPENDENT_AMBULATORY_CARE_PROVIDER_SITE_OTHER): Payer: BLUE CROSS/BLUE SHIELD | Admitting: Obstetrics and Gynecology

## 2016-09-09 ENCOUNTER — Encounter: Payer: Self-pay | Admitting: Obstetrics and Gynecology

## 2016-09-09 VITALS — BP 109/72 | HR 88 | Resp 18 | Ht 65.5 in | Wt 225.0 lb

## 2016-09-09 DIAGNOSIS — Z30017 Encounter for initial prescription of implantable subdermal contraceptive: Secondary | ICD-10-CM

## 2016-09-09 LAB — POCT URINE PREGNANCY: Preg Test, Ur: NEGATIVE

## 2016-09-09 MED ORDER — ETONOGESTREL 68 MG ~~LOC~~ IMPL
68.0000 mg | DRUG_IMPLANT | Freq: Once | SUBCUTANEOUS | Status: AC
  Administered 2016-09-09: 68 mg via SUBCUTANEOUS

## 2016-09-09 NOTE — Progress Notes (Signed)
21 yo here for Nexplanon insertion  Patient given informed consent, signed copy in the chart, time out was performed. Pregnancy test was negative. Appropriate time out taken.  Patient's left arm was prepped and draped in the usual sterile fashion. The ruler used to measure and mark insertion area.  Patient was prepped with alcohol swab and then injected with 2 cc of 1% lidocaine with epinephrine.  Patient was prepped with betadine, Nexplanon removed form packaging.  Device confirmed in needle, then inserted full length of needle and withdrawn per handbook instructions.  Patient insertion site covered with steri strips and a pressure bandage.  Minimal blood loss.  Patient tolerated the procedure well.

## 2016-09-09 NOTE — Addendum Note (Signed)
Addended by: Gita Kudo on: 09/09/2016 09:06 AM   Modules accepted: Orders

## 2016-11-01 ENCOUNTER — Encounter: Payer: Self-pay | Admitting: Emergency Medicine

## 2016-11-01 DIAGNOSIS — N739 Female pelvic inflammatory disease, unspecified: Secondary | ICD-10-CM | POA: Diagnosis not present

## 2016-11-01 DIAGNOSIS — R1032 Left lower quadrant pain: Secondary | ICD-10-CM | POA: Diagnosis present

## 2016-11-01 NOTE — ED Triage Notes (Signed)
Pt endorse left lower back pain and pelvic discomfort x 3 days with fever/chills. Denies urinary sx but has hx of uti's. Oral temp 99.6.

## 2016-11-02 ENCOUNTER — Emergency Department (HOSPITAL_COMMUNITY)
Admission: EM | Admit: 2016-11-02 | Discharge: 2016-11-02 | Disposition: A | Discharge status: Home / Self Care | Payer: BLUE CROSS/BLUE SHIELD | Attending: Emergency Medicine | Admitting: Emergency Medicine

## 2016-11-02 DIAGNOSIS — N730 Acute parametritis and pelvic cellulitis: Secondary | ICD-10-CM

## 2016-11-02 DIAGNOSIS — A749 Chlamydial infection, unspecified: Secondary | ICD-10-CM | POA: Insufficient documentation

## 2016-11-02 LAB — CBC WITH DIFFERENTIAL/PLATELET
Basophils Absolute: 0 10*3/uL (ref 0.0–0.1)
Basophils Relative: 0 %
Eosinophils Absolute: 0.1 10*3/uL (ref 0.0–0.7)
Eosinophils Relative: 1 %
HCT: 34.9 % — ABNORMAL LOW (ref 36.0–46.0)
Hemoglobin: 11.1 g/dL — ABNORMAL LOW (ref 12.0–15.0)
Lymphocytes Relative: 20 %
Lymphs Abs: 2.3 10*3/uL (ref 0.7–4.0)
MCH: 25.9 pg — ABNORMAL LOW (ref 26.0–34.0)
MCHC: 31.8 g/dL (ref 30.0–36.0)
MCV: 81.4 fL (ref 78.0–100.0)
Monocytes Absolute: 1.2 10*3/uL — ABNORMAL HIGH (ref 0.1–1.0)
Monocytes Relative: 10 %
Neutro Abs: 7.9 10*3/uL — ABNORMAL HIGH (ref 1.7–7.7)
Neutrophils Relative %: 69 %
Platelets: 251 10*3/uL (ref 150–400)
RBC: 4.29 MIL/uL (ref 3.87–5.11)
RDW: 14.5 % (ref 11.5–15.5)
WBC: 11.4 10*3/uL — ABNORMAL HIGH (ref 4.0–10.5)

## 2016-11-02 LAB — COMPREHENSIVE METABOLIC PANEL
ALT: 13 U/L — ABNORMAL LOW (ref 14–54)
AST: 17 U/L (ref 15–41)
Albumin: 3.5 g/dL (ref 3.5–5.0)
Alkaline Phosphatase: 64 U/L (ref 38–126)
Anion gap: 10 (ref 5–15)
BUN: 8 mg/dL (ref 6–20)
CO2: 23 mmol/L (ref 22–32)
Calcium: 9 mg/dL (ref 8.9–10.3)
Chloride: 102 mmol/L (ref 101–111)
Creatinine, Ser: 0.95 mg/dL (ref 0.44–1.00)
GFR calc Af Amer: >60 mL/min (ref >60)
GFR calc non Af Amer: >60 mL/min (ref >60)
Glucose, Bld: 110 mg/dL — ABNORMAL HIGH (ref 65–99)
Potassium: 3.6 mmol/L (ref 3.5–5.1)
Sodium: 135 mmol/L (ref 135–145)
Total Bilirubin: 0.3 mg/dL (ref 0.3–1.2)
Total Protein: 6.6 g/dL (ref 6.5–8.1)

## 2016-11-02 LAB — WET PREP, GENITAL
Sperm: NONE SEEN
Trich, Wet Prep: NONE SEEN
Yeast Wet Prep HPF POC: NONE SEEN

## 2016-11-02 LAB — URINALYSIS, ROUTINE W REFLEX MICROSCOPIC
Bilirubin Urine: NEGATIVE
Glucose, UA: NEGATIVE mg/dL
Ketones, ur: NEGATIVE mg/dL
Nitrite: NEGATIVE
Protein, ur: 30 mg/dL — AB
Specific Gravity, Urine: 1.013 (ref 1.005–1.030)
pH: 6 (ref 5.0–8.0)

## 2016-11-02 LAB — PREGNANCY, URINE: Preg Test, Ur: NEGATIVE

## 2016-11-02 LAB — GC/CHLAMYDIA PROBE AMP (~~LOC~~) NOT AT ARMC
Chlamydia: POSITIVE — AB
Neisseria Gonorrhea: NEGATIVE

## 2016-11-02 MED ORDER — AZITHROMYCIN 250 MG PO TABS
1000.0000 mg | ORAL_TABLET | Freq: Once | ORAL | Status: AC
  Administered 2016-11-02: 1000 mg via ORAL
  Filled 2016-11-02: qty 4

## 2016-11-02 MED ORDER — DOXYCYCLINE HYCLATE 100 MG PO CAPS: 100.0000 mg | ORAL_CAPSULE | Freq: Two times a day (BID) | ORAL | 0 refills | Status: DC

## 2016-11-02 MED ORDER — CEFTRIAXONE SODIUM 250 MG IJ SOLR
250.0000 mg | Freq: Once | INTRAMUSCULAR | Status: AC
  Administered 2016-11-02: 250 mg via INTRAMUSCULAR
  Filled 2016-11-02: qty 250

## 2016-11-02 NOTE — ED Notes (Signed)
ED Provider at bedside. 

## 2016-11-02 NOTE — Discharge Instructions (Signed)
Please return to the ER if your symptoms worsen; you have increased pain, fevers, chills, inability to keep any medications down, confusion. Otherwise see the outpatient doctor as requested.  

## 2016-11-02 NOTE — ED Provider Notes (Signed)
[facility]-EMERGENCY DEPT Provider Note   CSN: [id number] Arrival date & time: 11/01/16  2345  By signing my name below, I, Ny'Kea Lewis, attest that this documentation has been prepared under the direction and in the presence of Derwood Kaplan, MD. Electronically Signed: Karren Cobble, ED Scribe. 11/02/16. 2:57 AM.  History   Chief Complaint Chief Complaint  Patient presents with  . Back Pain   The history is provided by the patient and a parent. No language interpreter was used.    HPI Comments: Vanessa Hester is a 21 y.o. female with a PMHx of ovarian cyst who presents to the Emergency Department complaining of gradually worsening, intermittent left lower quadrant abdominal pain that radiates into her left lower back that began four days ago and became persistent three days ago. Pt notes associated left quadrant abdominal pain, chills and fever. She reports four days ago she began to experience left lower quadrant abdominal that started to radiating into her left lower back into the same area. Three days ago she reports a temperatures of 95.5 and 96.2, but today prior to arrival her fever was 101.4. She notes one episode of chills. Reports a hx of ovarian cyst and describes the pain as in her left lower quadrant and it did not radiate. Pt had Chlamydia three months, but denies similar pain. Denies unprotected sexual intercourse or multiple sexual partners at this time. LNMP was one week ago. No PSHx of abdominal surgery.  No hx of nephrolithiasis. Denies hematuria, dysuria, urinary frequency, vaginal discharge, vaginal bleeding, diarrhea, diaphoresis, emesis, or nausea.   Past Medical History:  Diagnosis Date  . Depression   . Febrile seizure (HCC) 10/10/14   When she was an infany  . Febrile seizure (HCC)   . Ovarian cyst    Patient Active Problem List   Diagnosis Date Noted  . Ovarian cyst 07/22/2016  . Anemia 02/05/2015  . Depression 02/05/2015  . Menorrhagia 02/04/2015  . Fatigue  02/04/2015  . Tension headache 02/04/2015  . IBS (irritable bowel syndrome) 02/04/2015  . Vitamin D deficiency 02/04/2015   Past Surgical History:  Procedure Laterality Date  . KNEE SURGERY Left   . 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     OB History    Gravida Para Term Preterm AB Living   0 0 0 0 0 0   SAB TAB Ectopic Multiple Live Births   0 0 0 0 0     Home Medications    Prior to Admission medications   Medication Sig Start Date End Date Taking? Authorizing Provider  acetaminophen (TYLENOL) 325 MG tablet Take 650 mg by mouth every 6 (six) hours as needed for mild pain.   Yes [provider]  doxycycline (VIBRAMYCIN) 100 MG capsule Take 1 capsule (100 mg total) by mouth 2 (two) times daily. 11/02/16   Derwood Kaplan, MD   Family History Family History  Problem Relation Age of Onset  . Migraines Father   . Hypertension Father   . Cancer Maternal Grandmother        colon and uterine  . Cancer Maternal Grandfather        prostate  . Arthritis Paternal Grandmother   . Diabetes Paternal Grandmother   . Asthma Paternal Grandfather   . Depression Mother   . Cancer Maternal Aunt        breast  . Diabetes Maternal Uncle   . Mental illness Paternal Aunt    Social History Social  History  Substance Use Topics  . Smoking status: Never Smoker  . Smokeless tobacco: Never Used  . Alcohol use No   Allergies   Other; Gardasil 9 [hpv 9-valent recomb vaccine]; and Amoxicillin  Review of Systems Review of Systems  Constitutional: Positive for chills and fever. Negative for diaphoresis.  Gastrointestinal: Positive for abdominal pain. Negative for diarrhea, nausea and vomiting.  Genitourinary: Negative for dysuria, frequency, vaginal bleeding and vaginal discharge.  Musculoskeletal: Positive for back pain.  All other systems reviewed and are negative.  Physical Exam Updated Vital Signs BP 109/82 (BP Location: Right Arm)   Pulse 79   Temp 98.2  F (36.8 C) (Oral)   Resp 18   Ht 5\' 6"  (1.676 m)   Wt 99.8 kg (220 lb)   LMP 10/22/2016 (Exact Date)   SpO2 99%   BMI 35.51 kg/m   Physical Exam  Constitutional: She is oriented to person, place, and time. She appears well-developed and well-nourished.  HENT:  Head: Normocephalic and atraumatic.  Eyes: Conjunctivae and EOM are normal. Pupils are equal, round, and reactive to light.  Neck: Normal range of motion. Neck supple.  Cardiovascular: Normal rate, regular rhythm, normal heart sounds and intact distal pulses.   No murmur heard. Pulmonary/Chest: Effort normal. No respiratory distress. She has no wheezes.  Abdominal: Soft. Bowel sounds are normal. She exhibits no distension. There is no tenderness. There is no rebound and no guarding.  Genitourinary: Vagina normal and uterus normal.  Genitourinary Comments: External exam - normal, no lesions Speculum exam: Pt has copious yellow discharge, no blood Bimanual exam: Patient has CMT, no adnexal tenderness or fullness and cervical os is closed  Musculoskeletal: Normal range of motion.  Neurological: She is alert and oriented to person, place, and time.  Skin: Skin is warm and dry.  Psychiatric: She has a normal mood and affect.  Nursing note and vitals reviewed.  ED Treatments / Results  DIAGNOSTIC STUDIES: Oxygen Saturation is 100% on RA, normal by my interpretation.   COORDINATION OF CARE: 2:49 AM-Discussed next steps with pt. Pt verbalized understanding and is agreeable with the plan.   Labs (all labs ordered are listed, but only abnormal results are displayed) Labs Reviewed  WET PREP, GENITAL - Abnormal; Notable for the following:       Result Value   Clue Cells Wet Prep HPF POC PRESENT (*)    WBC, Wet Prep HPF POC MANY (*)    All other components within normal limits  COMPREHENSIVE METABOLIC PANEL - Abnormal; Notable for the following:    Glucose, Bld 110 (*)    ALT 13 (*)    All other components within normal  limits  CBC WITH DIFFERENTIAL/PLATELET - Abnormal; Notable for the following:    WBC 11.4 (*)    Hemoglobin 11.1 (*)    HCT 34.9 (*)    MCH 25.9 (*)    Neutro Abs 7.9 (*)    Monocytes Absolute 1.2 (*)    All other components within normal limits  URINALYSIS, ROUTINE W REFLEX MICROSCOPIC - Abnormal; Notable for the following:    APPearance CLOUDY (*)    Hgb urine dipstick MODERATE (*)    Protein, ur 30 (*)    Leukocytes, UA LARGE (*)    Bacteria, UA FEW (*)    Squamous Epithelial / LPF 6-30 (*)    Non Squamous Epithelial 0-5 (*)    All other components within normal limits  GC/CHLAMYDIA PROBE AMP ([place]) NOT AT [facility] -  Abnormal; Notable for the following:    Chlamydia **POSITIVE** (*)    All other components within normal limits  PREGNANCY, URINE    EKG  EKG Interpretation None       Radiology No results found.  Procedures Procedures (including critical care time)  Medications Ordered in ED Medications  cefTRIAXone (ROCEPHIN) injection 250 mg (250 mg Intramuscular Given 11/02/16 0558)  azithromycin (ZITHROMAX) tablet 1,000 mg (1,000 mg Oral Given 11/02/16 0559)     Initial Impression / Assessment and Plan / ED Course  I have reviewed the triage vital signs and the nursing notes.  Pertinent labs & imaging results that were available during my care of the patient were reviewed by me and considered in my medical decision making (see chart for details).     Pt comes in with cc of pelvic pain. Concerns for PID and STDs based on the pelvic exam, and pt will be treated empirically for that.  Final Clinical Impressions(s) / ED Diagnoses   Final diagnoses:  Acute PID (pelvic inflammatory disease)   New Prescriptions Discharge Medication List as of 11/02/2016  7:31 AM    START taking these medications   Details  doxycycline (VIBRAMYCIN) 100 MG capsule Take 1 capsule (100 mg total) by mouth 2 (two) times daily., Starting Tue 11/02/2016, Print       I  personally performed the services described in this documentation, which was scribed in my presence. The recorded information has been reviewed and is accurate.     Derwood KaplanNanavati, Khadeejah Castner, MD 11/03/16 0930

## 2016-11-02 NOTE — ED Notes (Signed)
[facility] requesting [facility], states that she wants pain medicine and wants to know how much longer. Pt taken to triage for reassessment.

## 2016-11-05 ENCOUNTER — Other Ambulatory Visit: Payer: Self-pay | Admitting: Obstetrics and Gynecology

## 2016-11-05 ENCOUNTER — Encounter: Payer: Self-pay | Admitting: Obstetrics and Gynecology

## 2016-11-05 ENCOUNTER — Ambulatory Visit (INDEPENDENT_AMBULATORY_CARE_PROVIDER_SITE_OTHER): Payer: BLUE CROSS/BLUE SHIELD | Admitting: Obstetrics and Gynecology

## 2016-11-05 DIAGNOSIS — Z8742 Personal history of other diseases of the female genital tract: Secondary | ICD-10-CM | POA: Insufficient documentation

## 2016-11-05 NOTE — Progress Notes (Signed)
Obstetrics and Gynecology Return Patient Evaluation  Appointment Date: 11/05/2016  [facility]  Primary Care Provider: Kerman PasseyLada, Vanessa P  Referring Provider: ED  Chief Complaint: f/u PID  History of Present Illness: Vanessa OkaVictoria B Hester is a 21 y.o. Caucasian G0 (Patient's last menstrual period was 10/22/2016 (exact date).), seen for the above chief complaint. Her past medical history is significant for h/o chlamydia.  Patient went to ED on 6/19 for several day history of pain and had fever at home. Treated for PID, no u/s done. Swabs came back with negative Wet prep but +for chlamydia. Pt had +test in February and was treated back then. She was given rocephin and azithro. Pt states she hasn't been sexually active since February. Had 07/2015 hiv and rpr which wee negative.   No fevers, chills, pain.   Review of Systems: as noted in the History of Present Illness.  Past Medical History:  Past Medical History:  Diagnosis Date  . Depression   . Febrile seizure (HCC) 10/10/14   When she was an infany  . Febrile seizure (HCC)   . Ovarian cyst     Past Surgical History:  Past Surgical History:  Procedure Laterality Date  . KNEE SURGERY Left   . 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     Past Obstetrical History:  OB History  Gravida Para Term Preterm AB Living  0 0 0 0 0 0  SAB TAB Ectopic Multiple Live Births  0 0 0 0 0        Past Gynecological History: As per HPI.  Social History:  Social History   Social History  . Marital status: Single    Spouse name: N/A  . Number of children: N/A  . Years of education: N/A   Occupational History  . Not on file.   Social History Main Topics  . Smoking status: Never Smoker  . Smokeless tobacco: Never Used  . Alcohol use No  . Drug use: No  . Sexual activity: Yes    Birth control/ protection: Implant   Other Topics Concern  . Not on file   Social History  Narrative  . No narrative on file    Family History:  Family History  Problem Relation Age of Onset  . Migraines Father   . Hypertension Father   . Cancer Maternal Grandmother 3080       colon and uterine  . Cancer Maternal Grandfather        prostate  . Arthritis Paternal Grandmother   . Diabetes Paternal Grandmother   . Asthma Paternal Grandfather   . Depression Mother   . Cancer Maternal Aunt 40       breast  . Diabetes Maternal Uncle   . Mental illness Paternal Aunt      Medications Ms. Vanessa Hester had no medications administered during this visit. Current Outpatient Prescriptions  Medication Sig Dispense Refill  . acetaminophen (TYLENOL) 325 MG tablet Take 650 mg by mouth every 6 (six) hours as needed for mild pain.    Marland Kitchen. doxycycline (VIBRAMYCIN) 100 MG capsule Take 1 capsule (100 mg total) by mouth 2 (two) times daily. (Patient not taking: Reported on 11/05/2016) 28 capsule 0   No current facility-administered medications for this visit.     Allergies Other; Gardasil 9 [hpv 9-valent recomb vaccine]; Amoxicillin; and Latex   Physical Exam:  BP 111/73   Pulse 85   Ht 5\' 6"  (1.676 m)  Wt 222 lb (100.7 kg)   LMP 10/22/2016 (Exact Date)   BMI 35.83 kg/m  Body mass index is 35.83 kg/m. General appearance: Well nourished, well developed female in no acute distress.  Abdomen: soft, nttp, nd  Laboratory: none  Radiology: none  Assessment: pt doing well  Plan:  Pt told likely just didn't resolve with standard treatment. Told her to fill the doxy to make sure she is completely treated and come back in 6wks for RN self swab.   RTC 6wks for self swab  Cornelia Copa MD Attending Center for [facility])

## 2016-11-05 NOTE — Progress Notes (Signed)
LLQ/pelvic pain that radiates around left side to mid back. Rates pain 0/10 today. She states she was seen at ED for same issue and was gives "pills and shot" which helped but never got Rx filled. She states she would like second opinion as she was Dx with PID and doesn't think that is the case.

## 2016-12-17 ENCOUNTER — Other Ambulatory Visit: Payer: BLUE CROSS/BLUE SHIELD

## 2017-03-25 NOTE — Telephone Encounter (Signed)
Error

## 2017-04-27 ENCOUNTER — Encounter: Payer: Self-pay | Admitting: Intensive Care

## 2017-04-27 ENCOUNTER — Emergency Department: Payer: Self-pay

## 2017-04-27 ENCOUNTER — Emergency Department
Admission: EM | Admit: 2017-04-27 | Discharge: 2017-04-27 | Disposition: A | Discharge status: Home / Self Care | Payer: Self-pay | Attending: Student in an Organized Health Care Education/Training Program | Admitting: Student in an Organized Health Care Education/Training Program

## 2017-04-27 DIAGNOSIS — J209 Acute bronchitis, unspecified: Secondary | ICD-10-CM | POA: Insufficient documentation

## 2017-04-27 DIAGNOSIS — Z91040 Latex allergy status: Secondary | ICD-10-CM | POA: Insufficient documentation

## 2017-04-27 DIAGNOSIS — F17290 Nicotine dependence, other tobacco product, uncomplicated: Secondary | ICD-10-CM | POA: Insufficient documentation

## 2017-04-27 LAB — POCT RAPID STREP A: Streptococcus, Group A Screen (Direct): NEGATIVE

## 2017-04-27 MED ORDER — PREDNISONE 50 MG PO TABS: ORAL_TABLET | ORAL | 0 refills | Status: DC

## 2017-04-27 MED ORDER — AZITHROMYCIN 250 MG PO TABS: ORAL_TABLET | ORAL | 0 refills | Status: AC

## 2017-04-27 NOTE — ED Provider Notes (Signed)
[facility] Emergency Department Provider Note  ____________________________________________  Time seen: Approximately 7:44 PM  I have reviewed the triage vital signs and the nursing notes.   HISTORY  Chief Complaint Nasal Congestion; Cough; and Sore Throat    HPI Vanessa Hester is a 21 y.o. female patient presents to the emergency department with rhinorrhea, congestion, nonproductive cough and fever for the past week.  Patient reports that cough is occasionally productive.  She does experience intermittent shortness of breath.  No chest tightness or chest pain.  She denies nausea, vomiting abdominal pain.  She is tolerating fluids by mouth.  No emesis.  No recent travel.   Past Medical History:  Diagnosis Date  . Depression   . Febrile seizure (HCC) 10/10/14   When she was an infany  . Febrile seizure (HCC)   . Ovarian cyst     Patient Active Problem List   Diagnosis Date Noted  . 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 Chlamydia infection 11/02/2016  . Ovarian cyst 07/22/2016  . Anemia 02/05/2015  . Depression 02/05/2015  . Menorrhagia 02/04/2015  . Fatigue 02/04/2015  . Tension headache 02/04/2015  . IBS (irritable bowel syndrome) 02/04/2015  . Vitamin D deficiency 02/04/2015    Past Surgical History:  Procedure Laterality Date  . KNEE SURGERY Left   . 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     Prior to Admission medications   Medication Sig Start Date End Date Taking? Authorizing Provider  acetaminophen (TYLENOL) 325 MG tablet Take 650 mg by mouth every 6 (six) hours as needed for mild pain.    [provider]  azithromycin (ZITHROMAX Z-PAK) 250 MG tablet Take 2 tablets (500 mg) on  Day 1,  followed by 1 tablet (250 mg) once daily on Days 2 through 5. 04/27/17 05/02/17  Orvil Feil, PA-C  doxycycline (VIBRAMYCIN) 100 MG capsule Take 1 capsule (100 mg total) by mouth 2 (two) times daily. Patient not taking: Reported  on 11/05/2016 11/02/16   Derwood Kaplan, MD  predniSONE (DELTASONE) 50 MG tablet Take one 50 mg tablet once a day for five days. 04/27/17   Orvil Feil, PA-C    Allergies Other; Gardasil 9 [hpv 9-valent recomb vaccine]; Amoxicillin; and Latex  Family History  Problem Relation Age of Onset  . Migraines Father   . Hypertension Father   . Cancer Maternal Grandmother 31       colon and uterine  . Cancer Maternal Grandfather        prostate  . Arthritis Paternal Grandmother   . Diabetes Paternal Grandmother   . Asthma Paternal Grandfather   . Depression Mother   . Cancer Maternal Aunt 40       breast  . Diabetes Maternal Uncle   . Mental illness Paternal Aunt     Social History Social History   Tobacco Use  . Smoking status: Current Every Day Smoker    Types: E-cigarettes  . Smokeless tobacco: Never Used  Substance Use Topics  . Alcohol use: No  . Drug use: No     Review of Systems  Constitutional: Patient has fever.  Eyes: No visual changes. No discharge ENT: Patient has congestion.  Cardiovascular: no chest pain. Respiratory: Patient has cough.  Gastrointestinal: No abdominal pain.  No nausea, no vomiting. No diarrhea.  Genitourinary: Negative for dysuria. No hematuria Musculoskeletal: Patient has myalgias.  Skin: Negative for rash, abrasions, lacerations, ecchymosis. Neurological: Patient has headache, no focal  weakness or numbness.  ____________________________________________   PHYSICAL EXAM:  VITAL SIGNS: ED Triage Vitals  Enc Vitals Group     BP 04/27/17 1738 123/71     Pulse Rate 04/27/17 1738 (!) 109     Resp 04/27/17 1738 16     Temp 04/27/17 1738 98.8 F (37.1 C)     Temp Source 04/27/17 1738 Oral     SpO2 04/27/17 1738 98 %     Weight 04/27/17 1739 230 lb (104.3 kg)     Height 04/27/17 1739 5\' 6"  (1.676 m)     Head Circumference --      Peak Flow --      Pain Score 04/27/17 1738 10     Pain Loc --      Pain Edu? --      Excl. in GC? --      Constitutional: Alert and oriented. Patient is lying supine. Eyes: Conjunctivae are normal. PERRL. EOMI. Head: Atraumatic. ENT:      Ears: Tympanic membranes are mildly injected with mild effusion bilaterally.       Nose: No congestion/rhinnorhea.      Mouth/Throat: Mucous membranes are moist. Posterior pharynx is mildly erythematous.  Hematological/Lymphatic/Immunilogical: No cervical lymphadenopathy.  Cardiovascular: Normal rate, regular rhythm. Normal S1 and S2.  Good peripheral circulation. Respiratory: Normal respiratory effort without tachypnea or retractions. Lungs CTAB. Good air entry to the bases with no decreased or absent breath sounds. Gastrointestinal: Bowel sounds 4 quadrants. Soft and nontender to palpation. No guarding or rigidity. No palpable masses. No distention. No CVA tenderness. Musculoskeletal: Full range of motion to all extremities. No gross deformities appreciated. Neurologic:  Normal speech and language. No gross focal neurologic deficits are appreciated.  Skin:  Skin is warm, dry and intact. No rash noted. Psychiatric: Mood and affect are normal. Speech and behavior are normal. Patient exhibits appropriate insight and judgement.    ____________________________________________   LABS (all labs ordered are listed, but only abnormal results are displayed)  Labs Reviewed  POCT RAPID STREP A   ____________________________________________  EKG   ____________________________________________  RADIOLOGY Geraldo PitterI, Mihira Tozzi M Zymir Napoli, personally viewed and evaluated these images (plain radiographs) as part of my medical decision making, as well as reviewing the written report by the radiologist.  Dg Chest 2 View  Result Date: 04/27/2017 CLINICAL DATA:  Cough congestion and fever for 2 days EXAM: CHEST  2 VIEW COMPARISON:  09/19/2010 FINDINGS: The lungs are clear. The pulmonary vasculature is normal. Heart size is normal. Hilar and mediastinal contours are  unremarkable. There is no pleural effusion. IMPRESSION: No active cardiopulmonary disease. Electronically Signed   By: Ellery Plunkaniel R Mitchell M.D.   On: 04/27/2017 19:07    ____________________________________________    PROCEDURES  Procedure(s) performed:    Procedures    Medications - No data to display   ____________________________________________   INITIAL IMPRESSION / ASSESSMENT AND PLAN / ED COURSE  Pertinent labs & imaging results that were available during my care of the patient were reviewed by me and considered in my medical decision making (see chart for details).  Review of the [place] CSRS was performed in accordance of the [facility] prior to dispensing any controlled drugs.    Assessment and plan Acute bronchitis Patient presents to the emergency department with symptoms consistent with acute bronchitis.  Patient was treated empirically with azithromycin and prednisone.  She was advised to follow-up with primary care as needed.  Chest x-ray revealed no acute consolidations or findings consistent with pneumonia.  All patient questions were answered.     ____________________________________________  FINAL CLINICAL IMPRESSION(S) / ED DIAGNOSES  Final diagnoses:  Acute bronchitis, unspecified organism      NEW MEDICATIONS STARTED DURING THIS VISIT:  ED Discharge Orders        Ordered    predniSONE (DELTASONE) 50 MG tablet     04/27/17 1937    azithromycin (ZITHROMAX Z-PAK) 250 MG tablet     04/27/17 1937          This chart was dictated using voice recognition software/Dragon. Despite best efforts to proofread, errors can occur which can change the meaning. Any change was purely unintentional.    Orvil FeilWoods, Jazlyn Tippens M, PA-C 04/27/17 1952    Willy Eddyobinson, Patrick, MD 04/27/17 2023

## 2017-04-27 NOTE — ED Notes (Signed)
Pt ambulatory to xray.

## 2017-04-27 NOTE — ED Notes (Signed)
See triage note   States she is here with cough and sore throat . Also has had fever of 101 at home  Afebrile on arrival  States she is also having some pressure behind eyes

## 2017-04-27 NOTE — ED Triage Notes (Signed)
Patient reports cough, congestion, sore throat, and fever for a couple of days. Patient reports HX strep and currently has white spots in back of throat. Reports no OTC meds taken today

## 2017-10-04 ENCOUNTER — Emergency Department
Admission: EM | Admit: 2017-10-04 | Discharge: 2017-10-04 | Disposition: A | Discharge status: Home / Self Care | Payer: Self-pay | Attending: Emergency Medicine | Admitting: Emergency Medicine

## 2017-10-04 ENCOUNTER — Encounter: Payer: Self-pay | Admitting: Emergency Medicine

## 2017-10-04 DIAGNOSIS — Z91040 Latex allergy status: Secondary | ICD-10-CM | POA: Insufficient documentation

## 2017-10-04 DIAGNOSIS — L551 Sunburn of second degree: Secondary | ICD-10-CM | POA: Insufficient documentation

## 2017-10-04 DIAGNOSIS — L550 Sunburn of first degree: Secondary | ICD-10-CM | POA: Insufficient documentation

## 2017-10-04 DIAGNOSIS — F17290 Nicotine dependence, other tobacco product, uncomplicated: Secondary | ICD-10-CM | POA: Insufficient documentation

## 2017-10-04 MED ORDER — IBUPROFEN 800 MG PO TABS: 800.0000 mg | ORAL_TABLET | Freq: Three times a day (TID) | ORAL | 0 refills | Status: DC | PRN

## 2017-10-04 NOTE — Discharge Instructions (Addendum)
Apply cool compresses to any areas that are swollen and burn.  Use aloe vera.  Use Cetaphil lotion.  Use the ibuprofen as prescribed.  If you develop more swelling, headache or increased nausea or vomiting please return the emergency department

## 2017-10-04 NOTE — ED Triage Notes (Signed)
Patient presents to the ED with severe sunburn since SUnday.   Patient states she was at the beach all day and then noticed her skin reddening on the way home.  Patient reports this morning when she woke up her skin was oozing yellow/green fluid.  No oozing noted at this time.  Patient's face and arms appear very red.  Patient states she has been putting aloe but she is concerned about severity of burn.

## 2017-10-04 NOTE — ED Provider Notes (Signed)
[facility] Emergency Department Provider Note  ____________________________________________   First MD Initiated Contact with Patient 10/04/17 1406     (approximate)  I have reviewed the triage vital signs and the nursing notes.   HISTORY  Chief Complaint Sunburn    HPI Vanessa Hester is a 22 y.o. female presents emergency department complaining about some burn since Sunday.  She states she was at the beach and noticed that her skin got red on the way home.  She states she has some blisters on her shoulders and had some on her forehead.  The rest of her is just red.  States her eyes are little swollen.  She does not have a headache or nausea at this time.  She did use aloe vera and took some Tylenol without relief.  She denies any vomiting.  She denies swelling in the extremities.  Past Medical History:  Diagnosis Date  . Depression   . Febrile seizure (HCC) 10/10/14   When she was an infany  . Febrile seizure (HCC)   . Ovarian cyst     Patient Active Problem List   Diagnosis Date Noted  . 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 Chlamydia infection 11/02/2016  . Ovarian cyst 07/22/2016  . Anemia 02/05/2015  . Depression 02/05/2015  . Menorrhagia 02/04/2015  . Fatigue 02/04/2015  . Tension headache 02/04/2015  . IBS (irritable bowel syndrome) 02/04/2015  . Vitamin D deficiency 02/04/2015    Past Surgical History:  Procedure Laterality Date  . KNEE SURGERY Left   . 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     Prior to Admission medications   Medication Sig Start Date End Date Taking? Authorizing Provider  acetaminophen (TYLENOL) 325 MG tablet Take 650 mg by mouth every 6 (six) hours as needed for mild pain.    [provider]  ibuprofen (ADVIL,MOTRIN) 800 MG tablet Take 1 tablet (800 mg total) by mouth every 8 (eight) hours as needed. 10/04/17   Fisher, Roselyn Bering, PA-C  predniSONE (DELTASONE) 50 MG tablet Take one 50 mg tablet  once a day for five days. 04/27/17   Orvil Feil, PA-C    Allergies Other; Gardasil 9 [hpv 9-valent recomb vaccine]; Amoxicillin; and Latex  Family History  Problem Relation Age of Onset  . Migraines Father   . Hypertension Father   . Cancer Maternal Grandmother 81       colon and uterine  . Cancer Maternal Grandfather        prostate  . Arthritis Paternal Grandmother   . Diabetes Paternal Grandmother   . Asthma Paternal Grandfather   . Depression Mother   . Cancer Maternal Aunt 40       breast  . Diabetes Maternal Uncle   . Mental illness Paternal Aunt     Social History Social History   Tobacco Use  . Smoking status: Current Every Day Smoker    Types: E-cigarettes  . Smokeless tobacco: Never Used  Substance Use Topics  . Alcohol use: No  . Drug use: No    Review of Systems  Constitutional: No fever/chills Eyes: No visual changes. ENT: No sore throat. Respiratory: Denies cough Genitourinary: Negative for dysuria. Musculoskeletal: Negative for back pain. Skin: Negative for rash.  Positive for sunburn    ____________________________________________   PHYSICAL EXAM:  VITAL SIGNS: ED Triage Vitals  Enc Vitals Group     BP 10/04/17 1352 136/80     Pulse Rate 10/04/17  1352 96     Resp 10/04/17 1352 18     Temp 10/04/17 1352 98.3 F (36.8 C)     Temp Source 10/04/17 1352 Oral     SpO2 10/04/17 1352 97 %     Weight 10/04/17 1353 230 lb (104.3 kg)     Height 10/04/17 1353  (1.651 m)     Head Circumference --      Peak Flow --      Pain Score 10/04/17 1353 10     Pain Loc --      Pain Edu? --      Excl. in GC? --     Constitutional: Alert and oriented. Well appearing and in no acute distress. Eyes: Conjunctivae are normal.  Head: Atraumatic. Nose: No congestion/rhinnorhea. Mouth/Throat: Mucous membranes are moist.   Cardiovascular: Normal rate, regular rhythm.  Heart sounds are normal Respiratory: Normal respiratory effort.  No  retractions, lungs clear to auscultation GU: deferred Musculoskeletal: FROM all extremities, warm and well perfused Neurologic:  Normal speech and language.  Skin:  Skin is warm, dry and intact.  Positive for redness along the face chest and legs.  There is some redness on the back.  The shoulders have blisters.  The face had a few blisters on the forehead.  Most of the burn is first-degree. Psychiatric: Mood and affect are normal. Speech and behavior are normal.  ____________________________________________   LABS (all labs ordered are listed, but only abnormal results are displayed)  Labs Reviewed - No data to display ____________________________________________   ____________________________________________  RADIOLOGY    ____________________________________________   PROCEDURES  Procedure(s) performed: No  Procedures    ____________________________________________   INITIAL IMPRESSION / ASSESSMENT AND PLAN / ED COURSE  Pertinent labs & imaging results that were available during my care of the patient were reviewed by me and considered in my medical decision making (see chart for details).  Patient is 22 year old female presents emergency department complaining of sunburn with blisters on the face and shoulders.  On physical exam she appears very well.  Is no swelling extremities.  The skin is red along with front with some blisters on the shoulders and forehead.  There is no swelling of the extremities.  Physical exam findings were discussed with the patient.  Sunburn care was discussed with the patient.  She was given a prescription for ibuprofen 800 mg 3 times a day with food for pain and inflammation.  She is to continue aloe vera followed with a lotion.  She is to drink plenty of fluids and stay hydrated.  She states she understands comply with our instructions.  If she is worsening she should return to emergency department.  She was discharged in stable  condition     As part of my medical decision making, I reviewed the following data within the electronic MEDICAL RECORD NUMBER Nursing notes reviewed and incorporated, Notes from prior ED visits and [place] Controlled Substance Database  ____________________________________________   FINAL CLINICAL IMPRESSION(S) / ED DIAGNOSES  Final diagnoses:  Sunburn of first degree  Sunburn of second degree      NEW MEDICATIONS STARTED DURING THIS VISIT:  Discharge Medication List as of 10/04/2017  2:29 PM    START taking these medications   Details  ibuprofen (ADVIL,MOTRIN) 800 MG tablet Take 1 tablet (800 mg total) by mouth every 8 (eight) hours as needed., Starting Tue 10/04/2017, Print         Note:  This document was prepared using  [employer] and may include unintentional dictation errors.    Faythe Ghee, PA-C 10/04/17 1631    Loleta Rose, MD 10/04/17 [phone]

## 2018-03-01 DIAGNOSIS — L03115 Cellulitis of right lower limb: Secondary | ICD-10-CM | POA: Insufficient documentation

## 2018-06-06 ENCOUNTER — Telehealth: Payer: Self-pay | Admitting: *Deleted

## 2018-06-06 NOTE — Telephone Encounter (Signed)
Pt called concerned about spotting she is having x 2 weeks and pain with urination. Has the nexplanon ion place and has not experienced these symptoms till now. Advised that patient should be seen due to these symptoms.

## 2018-06-06 NOTE — Progress Notes (Signed)
HPV? PAP today  STI testing   U/A show moderate blood

## 2018-06-07 ENCOUNTER — Ambulatory Visit (INDEPENDENT_AMBULATORY_CARE_PROVIDER_SITE_OTHER): Payer: 59 | Admitting: Advanced Practice Midwife

## 2018-06-07 ENCOUNTER — Encounter: Payer: Self-pay | Admitting: Advanced Practice Midwife

## 2018-06-07 VITALS — BP 115/76 | HR 72 | Wt 259.2 lb

## 2018-06-07 DIAGNOSIS — Z1151 Encounter for screening for human papillomavirus (HPV): Secondary | ICD-10-CM | POA: Diagnosis not present

## 2018-06-07 DIAGNOSIS — Z01419 Encounter for gynecological examination (general) (routine) without abnormal findings: Secondary | ICD-10-CM

## 2018-06-07 DIAGNOSIS — Z124 Encounter for screening for malignant neoplasm of cervix: Secondary | ICD-10-CM

## 2018-06-07 DIAGNOSIS — F17200 Nicotine dependence, unspecified, uncomplicated: Secondary | ICD-10-CM

## 2018-06-07 DIAGNOSIS — Z113 Encounter for screening for infections with a predominantly sexual mode of transmission: Secondary | ICD-10-CM | POA: Diagnosis not present

## 2018-06-07 DIAGNOSIS — F1210 Cannabis abuse, uncomplicated: Secondary | ICD-10-CM

## 2018-06-07 DIAGNOSIS — R300 Dysuria: Secondary | ICD-10-CM | POA: Diagnosis not present

## 2018-06-07 DIAGNOSIS — N898 Other specified noninflammatory disorders of vagina: Secondary | ICD-10-CM | POA: Diagnosis not present

## 2018-06-07 DIAGNOSIS — Z975 Presence of (intrauterine) contraceptive device: Secondary | ICD-10-CM | POA: Insufficient documentation

## 2018-06-07 LAB — POCT URINALYSIS DIPSTICK
Glucose, UA: NEGATIVE
Leukocytes, UA: NEGATIVE
Protein, UA: NEGATIVE
Spec Grav, UA: 1.015 (ref 1.010–1.025)

## 2018-06-07 NOTE — Patient Instructions (Signed)
Preventive Care 18-39 Years, Female Preventive care refers to lifestyle choices and visits with your health care provider that can promote health and wellness. What does preventive care include?   A yearly physical exam. This is also called an annual well check.  Dental exams once or twice a year.  Routine eye exams. Ask your health care provider how often you should have your eyes checked.  Personal lifestyle choices, including: ? Daily care of your teeth and gums. ? Regular physical activity. ? Eating a healthy diet. ? Avoiding tobacco and drug use. ? Limiting alcohol use. ? Practicing safe sex. ? Taking vitamin and mineral supplements as recommended by your health care provider. What happens during an annual well check? The services and screenings done by your health care provider during your annual well check will depend on your age, overall health, lifestyle risk factors, and family history of disease. Counseling Your health care provider may ask you questions about your:  Alcohol use.  Tobacco use.  Drug use.  Emotional well-being.  Home and relationship well-being.  Sexual activity.  Eating habits.  Work and work environment.  Method of birth control.  Menstrual cycle.  Pregnancy history. Screening You may have the following tests or measurements:  Height, weight, and BMI.  Diabetes screening. This is done by checking your blood sugar (glucose) after you have not eaten for a while (fasting).  Blood pressure.  Lipid and cholesterol levels. These may be checked every 5 years starting at age 20.  Skin check.  Hepatitis C blood test.  Hepatitis B blood test.  Sexually transmitted disease (STD) testing.  BRCA-related cancer screening. This may be done if you have a family history of breast, ovarian, tubal, or peritoneal cancers.  Pelvic exam and Pap test. This may be done every 3 years starting at age 21. Starting at age 30, this may be done every 5  years if you have a Pap test in combination with an HPV test. Discuss your test results, treatment options, and if necessary, the need for more tests with your health care provider. Vaccines Your health care provider may recommend certain vaccines, such as:  Influenza vaccine. This is recommended every year.  Tetanus, diphtheria, and acellular pertussis (Tdap, Td) vaccine. You may need a Td booster every 10 years.  Varicella vaccine. You may need this if you have not been vaccinated.  HPV vaccine. If you are 26 or younger, you may need three doses over 6 months.  Measles, mumps, and rubella (MMR) vaccine. You may need at least one dose of MMR. You may also need a second dose.  Pneumococcal 13-valent conjugate (PCV13) vaccine. You may need this if you have certain conditions and were not previously vaccinated.  Pneumococcal polysaccharide (PPSV23) vaccine. You may need one or two doses if you smoke cigarettes or if you have certain conditions.  Meningococcal vaccine. One dose is recommended if you are age 19-21 years and a first-year college student living in a residence hall, or if you have one of several medical conditions. You may also need additional booster doses.  Hepatitis A vaccine. You may need this if you have certain conditions or if you travel or work in places where you may be exposed to hepatitis A.  Hepatitis B vaccine. You may need this if you have certain conditions or if you travel or work in places where you may be exposed to hepatitis B.  Haemophilus influenzae type b (Hib) vaccine. You may need this if you   have certain risk factors. Talk to your health care provider about which screenings and vaccines you need and how often you need them. This information is not intended to replace advice given to you by your health care provider. Make sure you discuss any questions you have with your health care provider. Document Released: 06/29/2001 Document Revised: 12/14/2016  Document Reviewed: 03/04/2015 Elsevier Interactive Patient Education  2019 [employer].

## 2018-06-07 NOTE — Progress Notes (Signed)
GYNECOLOGY ANNUAL PREVENTATIVE CARE ENCOUNTER NOTE  Subjective:   Vanessa Hester is a 23 y.o. G0P0000 female here for a routine annual gynecologic exam.  Current complaints: periumbilical cramping when voiding, straining for a bowel movement, or repositioning throughout the day.   Denies abnormal vaginal bleeding, discharge, pelvic pain, problems with intercourse or other gynecologic concerns.   Patient is not currently sexually active due to Chlamydia diagnosis and fear regarding risk of sexual contacts. She lives with her parents and grandparents. She feels safe and supported by her friends and family.   Gynecologic History No LMP recorded. Contraception: Nexplanon Last Pap: No history of pap.   Obstetric History OB History  Gravida Para Term Preterm AB Living  0 0 0 0 0 0  SAB TAB Ectopic Multiple Live Births  0 0 0 0 0    Past Medical History:  Diagnosis Date  . Depression   . Febrile seizure (HCC) 10/10/14   When she was an infany  . Febrile seizure (HCC)   . Ovarian cyst     Past Surgical History:  Procedure Laterality Date  . KNEE SURGERY Left   . 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     Current Outpatient Medications on File Prior to Visit  Medication Sig Dispense Refill  . acetaminophen (TYLENOL) 325 MG tablet Take 650 mg by mouth every 6 (six) hours as needed for mild pain.    Marland Kitchen ibuprofen (ADVIL,MOTRIN) 800 MG tablet Take 1 tablet (800 mg total) by mouth every 8 (eight) hours as needed. (Patient not taking: Reported on 06/07/2018) 30 tablet 0  . predniSONE (DELTASONE) 50 MG tablet Take one 50 mg tablet once a day for five days. (Patient not taking: Reported on 06/07/2018) 5 tablet 0   No current facility-administered medications on file prior to visit.     Allergies  Allergen Reactions  . Other Shortness Of Breath    Medication is "Durahistamine" used for coughing when she was a child  . Gardasil 9 [Hpv 9-Valent Recomb Vaccine]   .  Amoxicillin Hives  . Latex Rash    Social History:  reports that she has been smoking e-cigarettes. She has never used smokeless tobacco. She reports that she does not drink alcohol or use drugs.  Family History  Problem Relation Age of Onset  . Migraines Father   . Hypertension Father   . Cancer Maternal Grandmother 51       colon and uterine  . Cancer Maternal Grandfather        prostate  . Arthritis Paternal Grandmother   . Diabetes Paternal Grandmother   . Asthma Paternal Grandfather   . Depression Mother   . Cancer Maternal Aunt 40       breast  . Diabetes Maternal Uncle   . Mental illness Paternal Aunt     The following portions of the patient's history were reviewed and updated as appropriate: allergies, current medications, past family history, past medical history, past social history, past surgical history and problem list.  Review of Systems Pertinent items noted in HPI and remainder of comprehensive ROS otherwise negative.   Objective:  BP 115/76   Pulse 72   Wt 117.6 kg   BMI 43.13 kg/m  CONSTITUTIONAL: Well-developed, well-nourished female in no acute distress.  HENT:  Normocephalic, atraumatic, External right and left ear normal. Oropharynx is clear and moist EYES: Conjunctivae and EOM are normal. Pupils are equal, round, and reactive to light.  No scleral icterus.  NECK: Normal range of motion, supple, no masses.  Normal thyroid.  SKIN: Skin is warm and dry. No rash noted. Not diaphoretic. No erythema. No pallor. MUSCULOSKELETAL: Normal range of motion. No tenderness.  No cyanosis, clubbing, or edema.  2+ distal pulses. NEUROLOGIC: Alert and oriented to person, place, and time. Normal reflexes, muscle tone coordination. No cranial nerve deficit noted. PSYCHIATRIC: Normal mood and affect. Normal behavior. Normal judgment and thought content. CARDIOVASCULAR: Normal heart rate noted, regular rhythm RESPIRATORY: Clear to auscultation bilaterally. Effort and  breath sounds normal, no problems with respiration noted. BREASTS: Symmetric in size. No masses, skin changes, nipple drainage, or lymphadenopathy. ABDOMEN: Soft, normal bowel sounds, no distention noted.  No tenderness, rebound or guarding.  PELVIC: Normal appearing external genitalia; normal appearing vaginal mucosa and cervix.  No abnormal discharge noted.  Pap smear obtained.  Normal uterine size, no other palpable masses, no uterine or adnexal tenderness. No CMT  Assessment and Plan:  1. Well woman exam with routine gynecological exam --Reviewed recommendations for follow-up screening, condoms ofr STI protection  2. Nexplanon in place --Palpated left bicep   3. Dysuria --No concerning findings on urine dip  4. Screening examination for sexually transmitted disease --Hx Chlamydia, per patient request --Remote from sexual contact  5. Mild tetrahydrocannabinol (THC) abuse --Per patient, intermittent use for insomnia  6. Smoking --Vaping, does not smoke cigarettes --Declines cessation information  Greater than 50% of the 25 minute visit spent in counseling and coordination of care  Will follow up results of pap smear and manage accordingly. Routine preventative health maintenance measures emphasized. Please refer to After Visit Summary for other counseling recommendations.    Clayton Bibles, CNM [employer] for [employer], [facility]

## 2018-06-12 ENCOUNTER — Telehealth: Payer: Self-pay | Admitting: *Deleted

## 2018-06-12 LAB — CYTOLOGY - PAP
Candida vaginitis: NEGATIVE
Chlamydia: NEGATIVE
Diagnosis: NEGATIVE
HPV: NOT DETECTED
Neisseria Gonorrhea: NEGATIVE
Trichomonas: POSITIVE — AB

## 2018-06-12 NOTE — Telephone Encounter (Signed)
-----   Message from Calvert CantorSamantha C Weinhold, [place] sent at 06/12/2018  1:37 PM EST ----- Regarding: FW: Positive for trich. Please send txt to pharmacy, notify patient, and let her know she needs a test of cure about one month after treatment. Thanks! ----- Message ----- From: Kurtis BushmanGraham, Demetrice A, CMA Sent: 06/07/2018   9:18 AM EST To: Calvert CantorSamantha C Weinhold, CNM

## 2018-06-13 ENCOUNTER — Other Ambulatory Visit: Payer: Self-pay

## 2018-06-13 MED ORDER — METRONIDAZOLE 500 MG PO TABS: ORAL_TABLET | ORAL | 0 refills | Status: DC

## 2018-06-13 NOTE — Telephone Encounter (Signed)
Inform patient of positive Trich results and the need to be treated with Flagyl 2 grams PO 1. Inform patient her and partner need to be treated and no sexual activity for the next 7-10 days.  Advised patient to contact partner and have him go to the [facility] Department to be treated. Patient inform to schedule a follow up appointment in a month for test of cure.

## 2018-12-11 ENCOUNTER — Encounter: Payer: Self-pay | Admitting: Family Medicine

## 2018-12-11 ENCOUNTER — Ambulatory Visit: Payer: 59 | Admitting: Family Medicine

## 2018-12-11 NOTE — Progress Notes (Signed)
Patient did not keep appointment today. She will be called to reschedule.  

## 2019-01-08 ENCOUNTER — Ambulatory Visit (INDEPENDENT_AMBULATORY_CARE_PROVIDER_SITE_OTHER): Payer: 59 | Admitting: Family Medicine

## 2019-01-08 ENCOUNTER — Encounter: Payer: Self-pay | Admitting: Family Medicine

## 2019-01-08 ENCOUNTER — Other Ambulatory Visit: Payer: Self-pay

## 2019-01-08 VITALS — BP 102/70 | HR 108 | Temp 98.1°F | Ht 65.0 in | Wt 250.8 lb

## 2019-01-08 DIAGNOSIS — F32A Depression, unspecified: Secondary | ICD-10-CM

## 2019-01-08 DIAGNOSIS — Z6841 Body Mass Index (BMI) 40.0 and over, adult: Secondary | ICD-10-CM | POA: Diagnosis not present

## 2019-01-08 DIAGNOSIS — F329 Major depressive disorder, single episode, unspecified: Secondary | ICD-10-CM | POA: Diagnosis not present

## 2019-01-08 DIAGNOSIS — F419 Anxiety disorder, unspecified: Secondary | ICD-10-CM

## 2019-01-08 DIAGNOSIS — E559 Vitamin D deficiency, unspecified: Secondary | ICD-10-CM | POA: Diagnosis not present

## 2019-01-08 MED ORDER — SERTRALINE HCL 50 MG PO TABS: 50.0000 mg | ORAL_TABLET | Freq: Every day | ORAL | 2 refills | Status: DC

## 2019-01-08 NOTE — Patient Instructions (Signed)
It was a pleasure to meet you today  Please start sertraline 1/2 tablet at bedtime for 3 days then increase to 1 tablet at bedtime  Schedule a follow up for 6-8 weeks from now  Medication for depression and anxiety often takes 6-8 weeks to have a noticeable difference so stick with it. Also the best way for recovery is taking medication and seeing a therapist -- this is so important.      How to help anxiety and depression   1) Regular Exercise - walking, jogging, cycling, dancing, strength training - aiming for 150 minutes of exercise a week -- Yoga has been shown in research to reduce depression and anxiety -- with even just one hour long session per week -- Walk leisurely for 30 minutes every day  2)  Begin a Mindfulness/Meditation practice -- this can take a little as 3 minutes and is helpful for all kinds of mood issues -- You can find resources in books -- Or you can download apps like  -- Headspace App  -- Calm  -- Insignt Timer -- Stop, Breathe & Think   # With each of these Apps - you should decline the "start free trial" offer and as you search through the App should be able to access some of their free content. You can also chose to pay for the content if you find one that works well for you.    # Many of them also offer sleep specific content which may help with insomnia   3) Healthy Diet - Avoid fast foods and processed foods, eat mostly lean proteins, vegetables, fruits and whole grains -- Avoid or decrease Caffeine -- Avoid or decrease Alcohol -- Drink plenty of water, have a balanced diet -- Avoid cigarettes and marijuana (as well as other recreational drugs)   4) Consider contacting a professional therapist  - [place] [phone]
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
## 2019-01-09 LAB — CBC WITH DIFFERENTIAL/PLATELET
Basophils Absolute: 0.1 10*3/uL (ref 0.0–0.1)
Basophils Relative: 0.9 % (ref 0.0–3.0)
Eosinophils Absolute: 0.2 10*3/uL (ref 0.0–0.7)
Eosinophils Relative: 1.6 % (ref 0.0–5.0)
HCT: 39.8 % (ref 36.0–46.0)
Hemoglobin: 12.7 g/dL (ref 12.0–15.0)
Lymphocytes Relative: 19.1 % (ref 12.0–46.0)
Lymphs Abs: 2.3 10*3/uL (ref 0.7–4.0)
MCHC: 31.9 g/dL (ref 30.0–36.0)
MCV: 81.3 fl (ref 78.0–100.0)
Monocytes Absolute: 1 10*3/uL (ref 0.1–1.0)
Monocytes Relative: 8 % (ref 3.0–12.0)
Neutro Abs: 8.4 10*3/uL — ABNORMAL HIGH (ref 1.4–7.7)
Neutrophils Relative %: 70.4 % (ref 43.0–77.0)
Platelets: 346 10*3/uL (ref 150.0–400.0)
RBC: 4.9 Mil/uL (ref 3.87–5.11)
RDW: 15 % (ref 11.5–15.5)
WBC: 12 10*3/uL — ABNORMAL HIGH (ref 4.0–10.5)

## 2019-01-09 LAB — VITAMIN D 25 HYDROXY (VIT D DEFICIENCY, FRACTURES): VITD: 13.22 ng/mL — ABNORMAL LOW (ref 30.00–100.00)

## 2019-01-09 LAB — COMPREHENSIVE METABOLIC PANEL
ALT: 13 U/L (ref 0–35)
AST: 17 U/L (ref 0–37)
Albumin: 4.5 g/dL (ref 3.5–5.2)
Alkaline Phosphatase: 74 U/L (ref 39–117)
BUN: 14 mg/dL (ref 6–23)
CO2: 25 mEq/L (ref 19–32)
Calcium: 9.5 mg/dL (ref 8.4–10.5)
Chloride: 104 mEq/L (ref 96–112)
Creatinine, Ser: 0.85 mg/dL (ref 0.40–1.20)
GFR: 83.2 mL/min (ref >60.00)
Glucose, Bld: 78 mg/dL (ref 70–99)
Potassium: 4.3 mEq/L (ref 3.5–5.1)
Sodium: 138 mEq/L (ref 135–145)
Total Bilirubin: 0.5 mg/dL (ref 0.2–1.2)
Total Protein: 7.4 g/dL (ref 6.0–8.3)

## 2019-01-09 LAB — HEMOGLOBIN A1C: Hgb A1c MFr Bld: 5.8 % (ref 4.6–6.5)

## 2019-01-09 LAB — LIPID PANEL
Cholesterol: 173 mg/dL (ref 0–200)
HDL: 39 mg/dL — ABNORMAL LOW (ref >39.00)
LDL Cholesterol: 116 mg/dL — ABNORMAL HIGH (ref 0–99)
NonHDL: 134.18
Total CHOL/HDL Ratio: 4
Triglycerides: 93 mg/dL (ref 0.0–149.0)
VLDL: 18.6 mg/dL (ref 0.0–40.0)

## 2019-01-09 LAB — TSH: TSH: 2.62 u[IU]/mL (ref 0.35–4.50)

## 2019-01-10 ENCOUNTER — Encounter: Payer: Self-pay | Admitting: Family Medicine

## 2019-01-10 MED ORDER — VITAMIN D3 1.25 MG (50000 UT) PO TABS: 1.0000 | ORAL_TABLET | ORAL | 1 refills | Status: DC

## 2019-05-14 ENCOUNTER — Encounter: Payer: Self-pay | Admitting: Radiology

## 2019-05-25 ENCOUNTER — Ambulatory Visit: Payer: Managed Care, Other (non HMO) | Attending: Internal Medicine

## 2019-05-25 DIAGNOSIS — Z20822 Contact with and (suspected) exposure to covid-19: Secondary | ICD-10-CM

## 2019-05-26 LAB — NOVEL CORONAVIRUS, NAA: SARS-CoV-2, NAA: NOT DETECTED

## 2019-06-27 ENCOUNTER — Ambulatory Visit: Payer: Managed Care, Other (non HMO) | Attending: Internal Medicine

## 2019-06-27 DIAGNOSIS — Z20822 Contact with and (suspected) exposure to covid-19: Secondary | ICD-10-CM

## 2019-06-28 LAB — NOVEL CORONAVIRUS, NAA: SARS-CoV-2, NAA: NOT DETECTED

## 2019-07-04 ENCOUNTER — Other Ambulatory Visit: Payer: Self-pay

## 2019-07-04 ENCOUNTER — Ambulatory Visit (INDEPENDENT_AMBULATORY_CARE_PROVIDER_SITE_OTHER): Payer: Managed Care, Other (non HMO) | Admitting: Family Medicine

## 2019-07-04 ENCOUNTER — Encounter: Payer: Self-pay | Admitting: Family Medicine

## 2019-07-04 ENCOUNTER — Ambulatory Visit: Payer: Self-pay | Admitting: Family Medicine

## 2019-07-04 VITALS — BP 104/80 | HR 97 | Temp 98.1°F | Ht 65.0 in | Wt 250.1 lb

## 2019-07-04 DIAGNOSIS — F329 Major depressive disorder, single episode, unspecified: Secondary | ICD-10-CM | POA: Diagnosis not present

## 2019-07-04 DIAGNOSIS — F419 Anxiety disorder, unspecified: Secondary | ICD-10-CM | POA: Diagnosis not present

## 2019-07-04 DIAGNOSIS — F32A Depression, unspecified: Secondary | ICD-10-CM

## 2019-07-04 DIAGNOSIS — E559 Vitamin D deficiency, unspecified: Secondary | ICD-10-CM | POA: Diagnosis not present

## 2019-07-04 MED ORDER — VITAMIN D3 1.25 MG (50000 UT) PO TABS: 1.0000 | ORAL_TABLET | ORAL | 3 refills | Status: DC

## 2019-07-04 MED ORDER — SERTRALINE HCL 100 MG PO TABS: 100.0000 mg | ORAL_TABLET | Freq: Every day | ORAL | 3 refills | Status: DC

## 2019-07-04 NOTE — Patient Instructions (Signed)
Vitamin D replacement is Vitamin D3- 50,000 IU weekly, can get over the counter if expensive through your insurance  Medication for depression and anxiety often takes 6-8 weeks to have a noticeable difference so stick with it. Also the best way for recovery is taking medication and seeing a therapist -- this is so important.      How to help anxiety and depression   1) Regular Exercise - walking, jogging, cycling, dancing, strength training - aiming for 150 minutes of exercise a week -- Yoga has been shown in research to reduce depression and anxiety -- with even just one hour long session per week -- Walk leisurely for 30 minutes every day  2)  Begin a Mindfulness/Meditation practice -- this can take a little as 3 minutes and is helpful for all kinds of mood issues -- You can find resources in books -- Or you can download apps like  -- Headspace App  -- Calm  -- Insignt Timer -- Stop, Breathe & Think   # With each of these Apps - you should decline the "start free trial" offer and as you search through the App should be able to access some of their free content. You can also chose to pay for the content if you find one that works well for you.    # Many of them also offer sleep specific content which may help with insomnia   3) Healthy Diet - Avoid fast foods and processed foods, eat mostly lean proteins, vegetables, fruits and whole grains -- Avoid or decrease Caffeine -- Avoid or decrease Alcohol -- Drink plenty of water, have a balanced diet -- Avoid cigarettes and marijuana (as well as other recreational drugs)   4) Consider contacting a professional therapist  [facility] [phone]

## 2019-07-04 NOTE — Progress Notes (Signed)
Subjective:    Patient ID: Vanessa Hester, female    DOB: 12-25-1995, 24 y.o.   MRN: [id number]  HPI Chief Complaint  Patient presents with  . Medication Problem    Sertraline - not helping with sleep, actually worsening.    This is a 24 yo female who was seen 8/20 with anxiety and depression. Was started on sertraline. She was given #30 with 2 refills. Did not follow up. She has a new job with [employer] which she enjoys.  Still has waves of depression. Difficulty going and staying asleep. Will nap in early evening and then have difficulty going to sleep. Does adult coloring books, journaling. Enjoys going to museums. Has difficulty with focus. Walking at work on her lunch break. Some decreased appetite. Lives with her parents, younger siblings.     Was also found to be vitamin D deficient. Did not start vit d replacement.  Has gyn appointment next month. In a relationship. Has Nexplanon for contraception.   Review of Systems Per HPI    Objective:   Physical Exam Vitals reviewed.  Constitutional:      General: She is not in acute distress.    Appearance: Normal appearance. She is obese. She is not ill-appearing, toxic-appearing or diaphoretic.  Eyes:     Conjunctiva/sclera: Conjunctivae normal.  Cardiovascular:     Rate and Rhythm: Normal rate.  Pulmonary:     Effort: Pulmonary effort is normal.  Neurological:     Mental Status: She is alert and oriented to person, place, and time.  Psychiatric:        Mood and Affect: Mood normal.        Behavior: Behavior normal.        Thought Content: Thought content normal.        Judgment: Judgment normal.       BP 104/80 (BP Location: Left Arm, Patient Position: Sitting, Cuff Size: Large)   Pulse 97   Temp 98.1 F (36.7 C) (Temporal)   Ht 5\' 5"  (1.651 m)   Wt 250 lb 1.9 oz (113.5 kg)   SpO2 96%   BMI 41.62 kg/m  Wt Readings from Last 3 Encounters:  07/04/19 250 lb 1.9 oz (113.5 kg)  01/08/19 250 lb 12.8 oz  (113.8 kg)  06/07/18 259 lb 3.2 oz (117.6 kg)   Depression screen [facility] 2/9 07/04/2019 01/08/2019 02/05/2015  Decreased Interest 1 1 3   Down, Depressed, Hopeless 3 2 3   PHQ - 2 Score 4 3 6   Altered sleeping 3 2 3   Tired, decreased energy 2 0 2  Change in appetite 3 1 3   Feeling bad or failure about yourself  2 3 2   Trouble concentrating 1 1 2   Moving slowly or fidgety/restless 0 0 0  Suicidal thoughts 0 2 1  PHQ-9 Score 15 12 19   Difficult doing work/chores Somewhat difficult Very difficult -       Assessment & Plan:  1. Anxiety and depression - Has not had consistent use of sertraline to fully evaluate. Discussed taking regularly, working on sleep hygiene, continuing daily walks, stress relieving activities.  - follow up in 8 weeks, sooner if worsening symptoms, problems with medication. - sertraline (ZOLOFT) 100 MG tablet; Take 1 tablet (100 mg total) by mouth daily.  Dispense: 90 tablet; Refill: 3  2. Vitamin D deficiency - discussed reasonable sun exposure, importance of adequate calcium - Cholecalciferol (VITAMIN D3) 1.25 MG (50000 UT) TABS; Take 1 tablet by mouth every 7 (seven)  days.  Dispense: 12 tablet; Refill: 3  This visit occurred during the SARS-CoV-2 public health emergency.  Safety protocols were in place, including screening questions prior to the visit, additional usage of staff PPE, and extensive cleaning of exam room while observing appropriate contact time as indicated for disinfecting solutions.    Clarene Reamer, FNP-BC  [facility] at [facility], [facility]  07/05/2019 12:14 PM

## 2019-07-05 ENCOUNTER — Encounter: Payer: Self-pay | Admitting: Family Medicine

## 2019-07-18 ENCOUNTER — Other Ambulatory Visit: Payer: Self-pay

## 2019-07-18 ENCOUNTER — Encounter: Payer: Self-pay | Admitting: Advanced Practice Midwife

## 2019-07-18 ENCOUNTER — Ambulatory Visit (INDEPENDENT_AMBULATORY_CARE_PROVIDER_SITE_OTHER): Payer: Managed Care, Other (non HMO) | Admitting: Advanced Practice Midwife

## 2019-07-18 VITALS — BP 112/78 | HR 72 | Wt 258.0 lb

## 2019-07-18 DIAGNOSIS — Z113 Encounter for screening for infections with a predominantly sexual mode of transmission: Secondary | ICD-10-CM | POA: Diagnosis not present

## 2019-07-18 DIAGNOSIS — Z01419 Encounter for gynecological examination (general) (routine) without abnormal findings: Secondary | ICD-10-CM

## 2019-07-18 DIAGNOSIS — R319 Hematuria, unspecified: Secondary | ICD-10-CM

## 2019-07-18 DIAGNOSIS — N898 Other specified noninflammatory disorders of vagina: Secondary | ICD-10-CM | POA: Diagnosis not present

## 2019-07-18 DIAGNOSIS — N760 Acute vaginitis: Secondary | ICD-10-CM

## 2019-07-18 DIAGNOSIS — B9689 Other specified bacterial agents as the cause of diseases classified elsewhere: Secondary | ICD-10-CM

## 2019-07-18 LAB — POCT URINALYSIS DIPSTICK
Glucose, UA: NEGATIVE
Nitrite, UA: POSITIVE
Protein, UA: NEGATIVE
Spec Grav, UA: 1.015 (ref 1.010–1.025)
Urobilinogen, UA: 0.2 E.U./dL
pH, UA: 5.5 (ref 5.0–8.0)

## 2019-07-18 MED ORDER — NITROFURANTOIN MONOHYD MACRO 100 MG PO CAPS: 100.0000 mg | ORAL_CAPSULE | Freq: Two times a day (BID) | ORAL | 0 refills | Status: AC

## 2019-07-18 NOTE — Progress Notes (Signed)
GYNECOLOGY ANNUAL PREVENTATIVE CARE ENCOUNTER NOTE  History:     Vanessa Hester is a 24 y.o. G0P0000 female here for a routine annual gynecologic exam.  Current complaints:Hematuria without dysuria or other urinary related symptoms. Patient also states she is not sure when her Nexplanon is due to be removed .   Denies abnormal vaginal bleeding, discharge, pelvic pain, problems with intercourse or other gynecologic concerns.    Patient is sexually active, vapes, denies SI, HI, IPV   Gynecologic History No LMP recorded. Contraception: Nexplanon Last Pap: 06/07/2020. Results were: Normal with negative HPV  Obstetric History OB History  Gravida Para Term Preterm AB Living  0 0 0 0 0 0  SAB TAB Ectopic Multiple Live Births  0 0 0 0 0    Past Medical History:  Diagnosis Date  . Depression   . Febrile seizure (HCC) 10/10/14   When she was an infany  . Febrile seizure (HCC)   . Ovarian cyst     Past Surgical History:  Procedure Laterality Date  . KNEE SURGERY Left   . 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     Current Outpatient Medications on File Prior to Visit  Medication Sig Dispense Refill  . sertraline (ZOLOFT) 100 MG tablet Take 1 tablet (100 mg total) by mouth daily. 90 tablet 3  . acetaminophen (TYLENOL) 325 MG tablet Take 650 mg by mouth every 6 (six) hours as needed for mild pain.    . Cholecalciferol (VITAMIN D3) 1.25 MG (50000 UT) TABS Take 1 tablet by mouth every 7 (seven) days. (Patient not taking: Reported on 07/18/2019) 12 tablet 3  . ibuprofen (ADVIL,MOTRIN) 800 MG tablet Take 1 tablet (800 mg total) by mouth every 8 (eight) hours as needed. (Patient not taking: Reported on 07/18/2019) 30 tablet 0   No current facility-administered medications on file prior to visit.    Allergies  Allergen Reactions  . Other Shortness Of Breath    Medication is "Durahistamine" used for coughing when she was a child  . Gardasil 9 [Hpv 9-Valent Recomb  Vaccine]   . Amoxicillin Hives  . Latex Rash    Social History:  reports that she quit smoking about 2 years ago. Her smoking use included e-cigarettes. She has never used smokeless tobacco. She reports that she does not drink alcohol or use drugs.  Family History  Problem Relation Age of Onset  . Migraines Father   . Hypertension Father   . Cancer Maternal Grandmother 81       colon and uterine  . Cancer Maternal Grandfather        prostate  . Arthritis Paternal Grandmother   . Diabetes Paternal Grandmother   . Asthma Paternal Grandfather   . Depression Mother   . Cancer Maternal Aunt 40       breast  . Diabetes Maternal Uncle   . Mental illness Paternal Aunt     The following portions of the patient's history were reviewed and updated as appropriate: allergies, current medications, past family history, past medical history, past social history, past surgical history and problem list.  Review of Systems Pertinent items noted in HPI and remainder of comprehensive ROS otherwise negative.  Physical Exam:  BP 112/78   Pulse 72   Wt 258 lb (117 kg)   BMI 42.93 kg/m  CONSTITUTIONAL: Well-developed, well-nourished female in no acute distress.  HENT:  Normocephalic, atraumatic, External right and left ear normal. Oropharynx is  clear and moist EYES: Conjunctivae and EOM are normal. Pupils are equal, round, and reactive to light. No scleral icterus.  NECK: Normal range of motion, supple, no masses.  Normal thyroid.  SKIN: Skin is warm and dry. No rash noted. Not diaphoretic. No erythema. No pallor. MUSCULOSKELETAL: Normal range of motion. No tenderness.  No cyanosis, clubbing, or edema.  2+ distal pulses. NEUROLOGIC: Alert and oriented to person, place, and time. Normal reflexes, muscle tone coordination.  PSYCHIATRIC: Normal mood and affect. Normal behavior. Normal judgment and thought content. CARDIOVASCULAR: Normal heart rate noted, regular rhythm RESPIRATORY: Clear to  auscultation bilaterally. Effort and breath sounds normal, no problems with respiration noted. BREASTS: Symmetric in size. No masses, tenderness, skin changes, nipple drainage, or lymphadenopathy bilaterally. ABDOMEN: Soft, no distention noted.  No tenderness, rebound or guarding.  PELVIC: Normal appearing external genitalia and urethral meatus; normal appearing vaginal mucosa and cervix.  No abnormal discharge noted.  Pap smear obtained.  Normal uterine size, no other palpable masses, no uterine or adnexal tenderness.   Assessment and Plan:    1. Well woman exam - Trichomonas TOC collected today - Pap due 05/2021 - Nexplanon removal due 08/2020 - Hepatitis B surface antigen - HIV Antibody (routine testing w rflx) - RPR - Urine Culture - Cervicovaginal ancillary only  2. Hematuria, unspecified type - 2+ Leuks and nitrites - Rx Macrobid - POCT urinalysis dipstick - Cervicovaginal ancillary only  Routine preventative health maintenance measures emphasized. Please refer to After Visit Summary for other counseling recommendations.     Total visit time 30 minutes. Greater than 50% of visit spent in counseling and coordination of care.  Mallie Snooks, MSN, CNM Certified Nurse Midwife, [facility] for [employer], [place] [facility] 07/18/19 8:24 PM

## 2019-07-18 NOTE — Patient Instructions (Signed)
Preventive Care 21-23 Years Old, Female Preventive care refers to visits with your health care provider and lifestyle choices that can promote health and wellness. This includes:  A yearly physical exam. This may also be called an annual well check.  Regular dental visits and eye exams.  Immunizations.  Screening for certain conditions.  Healthy lifestyle choices, such as eating a healthy diet, getting regular exercise, not using drugs or products that contain nicotine and tobacco, and limiting alcohol use. What can I expect for my preventive care visit? Physical exam Your health care provider will check your:  Height and weight. This may be used to calculate body mass index (BMI), which tells if you are at a healthy weight.  Heart rate and blood pressure.  Skin for abnormal spots. Counseling Your health care provider may ask you questions about your:  Alcohol, tobacco, and drug use.  Emotional well-being.  Home and relationship well-being.  Sexual activity.  Eating habits.  Work and work environment.  Method of birth control.  Menstrual cycle.  Pregnancy history. What immunizations do I need?  Influenza (flu) vaccine  This is recommended every year. Tetanus, diphtheria, and pertussis (Tdap) vaccine  You may need a Td booster every 10 years. Varicella (chickenpox) vaccine  You may need this if you have not been vaccinated. Human papillomavirus (HPV) vaccine  If recommended by your health care provider, you may need three doses over 6 months. Measles, mumps, and rubella (MMR) vaccine  You may need at least one dose of MMR. You may also need a second dose. Meningococcal conjugate (MenACWY) vaccine  One dose is recommended if you are age 19-21 years and a first-year college student living in a residence hall, or if you have one of several medical conditions. You may also need additional booster doses. Pneumococcal conjugate (PCV13) vaccine  You may need  this if you have certain conditions and were not previously vaccinated. Pneumococcal polysaccharide (PPSV23) vaccine  You may need one or two doses if you smoke cigarettes or if you have certain conditions. Hepatitis A vaccine  You may need this if you have certain conditions or if you travel or work in places where you may be exposed to hepatitis A. Hepatitis B vaccine  You may need this if you have certain conditions or if you travel or work in places where you may be exposed to hepatitis B. Haemophilus influenzae type b (Hib) vaccine  You may need this if you have certain conditions. You may receive vaccines as individual doses or as more than one vaccine together in one shot (combination vaccines). Talk with your health care provider about the risks and benefits of combination vaccines. What tests do I need?  Blood tests  Lipid and cholesterol levels. These may be checked every 5 years starting at age 20.  Hepatitis C test.  Hepatitis B test. Screening  Diabetes screening. This is done by checking your blood sugar (glucose) after you have not eaten for a while (fasting).  Sexually transmitted disease (STD) testing.  BRCA-related cancer screening. This may be done if you have a family history of breast, ovarian, tubal, or peritoneal cancers.  Pelvic exam and Pap test. This may be done every 3 years starting at age 21. Starting at age 30, this may be done every 5 years if you have a Pap test in combination with an HPV test. Talk with your health care provider about your test results, treatment options, and if necessary, the need for more tests.   Follow these instructions at home: Eating and drinking   Eat a diet that includes fresh fruits and vegetables, whole grains, lean protein, and low-fat dairy.  Take vitamin and mineral supplements as recommended by your health care provider.  Do not drink alcohol if: ? Your health care provider tells you not to drink. ? You are  pregnant, may be pregnant, or are planning to become pregnant.  If you drink alcohol: ? Limit how much you have to 0-1 drink a day. ? Be aware of how much alcohol is in your drink. In the [place] one drink equals one 12 oz bottle of beer (355 mL), one 5 oz glass of wine (148 mL), or one 1 oz glass of hard liquor (44 mL). Lifestyle  Take daily care of your teeth and gums.  Stay active. Exercise for at least 30 minutes on 5 or more days each week.  Do not use any products that contain nicotine or tobacco, such as cigarettes, e-cigarettes, and chewing tobacco. If you need help quitting, ask your health care provider.  If you are sexually active, practice safe sex. Use a condom or other form of birth control (contraception) in order to prevent pregnancy and STIs (sexually transmitted infections). If you plan to become pregnant, see your health care provider for a preconception visit. What's next?  Visit your health care provider once a year for a well check visit.  Ask your health care provider how often you should have your eyes and teeth checked.  Stay up to date on all vaccines. This information is not intended to replace advice given to you by your health care provider. Make sure you discuss any questions you have with your health care provider. Document Revised: 01/12/2018 Document Reviewed: 01/12/2018 Elsevier Patient Education  2020 [employer].

## 2019-07-18 NOTE — Progress Notes (Signed)
Last pap 06/07/2018- normal  STI testing today Discuss birth control

## 2019-07-19 LAB — HEPATITIS B SURFACE ANTIGEN: Hepatitis B Surface Ag: NEGATIVE

## 2019-07-19 LAB — HIV ANTIBODY (ROUTINE TESTING W REFLEX): HIV Screen 4th Generation wRfx: NONREACTIVE

## 2019-07-19 LAB — RPR: RPR Ser Ql: NONREACTIVE

## 2019-07-20 ENCOUNTER — Other Ambulatory Visit: Payer: Self-pay | Admitting: Advanced Practice Midwife

## 2019-07-20 DIAGNOSIS — A749 Chlamydial infection, unspecified: Secondary | ICD-10-CM

## 2019-07-20 LAB — CERVICOVAGINAL ANCILLARY ONLY
Bacterial Vaginitis (gardnerella): POSITIVE — AB
Candida Glabrata: NEGATIVE
Candida Vaginitis: NEGATIVE
Chlamydia: POSITIVE — AB
Comment: NEGATIVE
Comment: NEGATIVE
Comment: NEGATIVE
Comment: NEGATIVE
Comment: NEGATIVE
Comment: NORMAL
Neisseria Gonorrhea: NEGATIVE
Trichomonas: NEGATIVE

## 2019-07-20 MED ORDER — AZITHROMYCIN 500 MG PO TABS: 1000.0000 mg | ORAL_TABLET | Freq: Once | ORAL | 0 refills | Status: AC

## 2019-07-20 NOTE — Progress Notes (Signed)
+   Chlamydia. Phone call attempted but received message "call cannot be completed". Patient notified via active MyChart.   Clayton Bibles, MSN, CNM Certified Nurse Midwife, [facility] for [employer], [facility] 07/20/19 2:46 PM

## 2019-07-22 LAB — URINE CULTURE

## 2019-08-12 ENCOUNTER — Other Ambulatory Visit: Payer: Self-pay

## 2019-08-12 ENCOUNTER — Emergency Department
Admission: EM | Admit: 2019-08-12 | Discharge: 2019-08-12 | Disposition: A | Discharge status: Home / Self Care | Payer: Managed Care, Other (non HMO) | Attending: Emergency Medicine | Admitting: Emergency Medicine

## 2019-08-12 DIAGNOSIS — Z87891 Personal history of nicotine dependence: Secondary | ICD-10-CM | POA: Diagnosis not present

## 2019-08-12 DIAGNOSIS — N3001 Acute cystitis with hematuria: Secondary | ICD-10-CM | POA: Diagnosis not present

## 2019-08-12 DIAGNOSIS — J029 Acute pharyngitis, unspecified: Secondary | ICD-10-CM | POA: Insufficient documentation

## 2019-08-12 DIAGNOSIS — R519 Headache, unspecified: Secondary | ICD-10-CM | POA: Insufficient documentation

## 2019-08-12 DIAGNOSIS — Z79899 Other long term (current) drug therapy: Secondary | ICD-10-CM | POA: Insufficient documentation

## 2019-08-12 DIAGNOSIS — Z91040 Latex allergy status: Secondary | ICD-10-CM | POA: Diagnosis not present

## 2019-08-12 DIAGNOSIS — R109 Unspecified abdominal pain: Secondary | ICD-10-CM | POA: Diagnosis present

## 2019-08-12 LAB — URINALYSIS, COMPLETE (UACMP) WITH MICROSCOPIC
Bilirubin Urine: NEGATIVE
Glucose, UA: NEGATIVE mg/dL
Ketones, ur: NEGATIVE mg/dL
Nitrite: POSITIVE — AB
Protein, ur: 100 mg/dL — AB
RBC / HPF: >50 RBC/hpf — ABNORMAL HIGH (ref 0–5)
Specific Gravity, Urine: 1.02 (ref 1.005–1.030)
WBC, UA: >50 WBC/hpf — ABNORMAL HIGH (ref 0–5)
pH: 8 (ref 5.0–8.0)

## 2019-08-12 LAB — CBC
HCT: 37.9 % (ref 36.0–46.0)
Hemoglobin: 11.8 g/dL — ABNORMAL LOW (ref 12.0–15.0)
MCH: 24.9 pg — ABNORMAL LOW (ref 26.0–34.0)
MCHC: 31.1 g/dL (ref 30.0–36.0)
MCV: 80 fL (ref 80.0–100.0)
Platelets: 336 10*3/uL (ref 150–400)
RBC: 4.74 MIL/uL (ref 3.87–5.11)
RDW: 15.3 % (ref 11.5–15.5)
WBC: 10.6 10*3/uL — ABNORMAL HIGH (ref 4.0–10.5)
nRBC: 0 % (ref 0.0–0.2)

## 2019-08-12 LAB — COMPREHENSIVE METABOLIC PANEL
ALT: 15 U/L (ref 0–44)
AST: 20 U/L (ref 15–41)
Albumin: 4 g/dL (ref 3.5–5.0)
Alkaline Phosphatase: 77 U/L (ref 38–126)
Anion gap: 8 (ref 5–15)
BUN: 11 mg/dL (ref 6–20)
CO2: 24 mmol/L (ref 22–32)
Calcium: 8.9 mg/dL (ref 8.9–10.3)
Chloride: 106 mmol/L (ref 98–111)
Creatinine, Ser: 0.64 mg/dL (ref 0.44–1.00)
GFR calc Af Amer: >60 mL/min (ref >60)
GFR calc non Af Amer: >60 mL/min (ref >60)
Glucose, Bld: 95 mg/dL (ref 70–99)
Potassium: 4.1 mmol/L (ref 3.5–5.1)
Sodium: 138 mmol/L (ref 135–145)
Total Bilirubin: 0.4 mg/dL (ref 0.3–1.2)
Total Protein: 7.3 g/dL (ref 6.5–8.1)

## 2019-08-12 LAB — LIPASE, BLOOD: Lipase: 32 U/L (ref 11–51)

## 2019-08-12 MED ORDER — SODIUM CHLORIDE 0.9 % IV BOLUS
1000.0000 mL | Freq: Once | INTRAVENOUS | Status: AC
  Administered 2019-08-12: 1000 mL via INTRAVENOUS

## 2019-08-12 MED ORDER — SODIUM CHLORIDE 0.9% FLUSH: 3.0000 mL | Freq: Once | INTRAVENOUS | Status: DC

## 2019-08-12 MED ORDER — ONDANSETRON 4 MG PO TBDP: 4.0000 mg | ORAL_TABLET | Freq: Three times a day (TID) | ORAL | 0 refills | Status: DC | PRN

## 2019-08-12 MED ORDER — SULFAMETHOXAZOLE-TRIMETHOPRIM 800-160 MG PO TABS: 1.0000 | ORAL_TABLET | Freq: Two times a day (BID) | ORAL | 0 refills | Status: DC

## 2019-08-12 NOTE — ED Provider Notes (Signed)
[facility] Emergency Department Provider Note ____________________________________________   First MD Initiated Contact with Patient 08/12/19 1205     (approximate)  I have reviewed the triage vital signs and the nursing notes.   HISTORY  Chief Complaint Emesis, Headache, and Sore Throat  HPI GELENE RECKTENWALD is a 24 y.o. female who presents to the emergency department for treatment and evaluation of nausea and vomiting. While at work yesterday, she may have drank from a cup where jewelry cleaner had dripped into it before she filled it with water and drank the water. A few minutes after drinking, she noticed that the inside of her lip was burning and itching. Yesterday evening she noticed a cold sore on the left side of her bottom lip. Vomiting started a short time after drinking the water. She has a "stomachache." no alleviating measures prior to arrival.     Past Medical History:  Diagnosis Date  . Depression   . Febrile seizure ([employer]) 10/10/14   When she was an infany  . Febrile seizure ([place])   . Ovarian cyst     Patient Active Problem List   Diagnosis Date Noted  . Nexplanon in place 06/07/2018  . Cellulitis of foot, right 03/01/2018  . 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 Chlamydia infection 11/02/2016  . Ovarian cyst 07/22/2016  . Anemia 02/05/2015  . Depression 02/05/2015  . Menorrhagia 02/04/2015  . Fatigue 02/04/2015  . Tension headache 02/04/2015  . IBS (irritable bowel syndrome) 02/04/2015  . Vitamin D deficiency 02/04/2015  . Dysfunctional uterine bleeding 09/20/2013    Past Surgical History:  Procedure Laterality Date  . KNEE SURGERY Left   . 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     Prior to Admission medications   Medication Sig Start Date End Date Taking? Authorizing Provider  acetaminophen (TYLENOL) 325 MG tablet Take 650 mg by mouth every 6 (six) hours as needed for mild pain.    [provider]    Cholecalciferol (VITAMIN D3) 1.25 MG (50000 UT) TABS Take 1 tablet by mouth every 7 (seven) days. Patient not taking: Reported on 07/18/2019 07/04/19   Elby Beck, FNP  ibuprofen (ADVIL,MOTRIN) 800 MG tablet Take 1 tablet (800 mg total) by mouth every 8 (eight) hours as needed. Patient not taking: Reported on 07/18/2019 10/04/17   Versie Starks, PA-C  ondansetron (ZOFRAN-ODT) 4 MG disintegrating tablet Take 1 tablet (4 mg total) by mouth every 8 (eight) hours as needed for nausea or vomiting. 08/12/19   Yahel Fuston B, FNP  sertraline (ZOLOFT) 100 MG tablet Take 1 tablet (100 mg total) by mouth daily. 07/04/19   Elby Beck, FNP  sulfamethoxazole-trimethoprim (BACTRIM DS) 800-160 MG tablet Take 1 tablet by mouth 2 (two) times daily. 08/12/19   Xochitl Egle, Johnette Abraham B, FNP    Allergies Other, Gardasil 9 [hpv 9-valent recomb vaccine], Amoxicillin, and Latex  Family History  Problem Relation Age of Onset  . Migraines Father   . Hypertension Father   . Cancer Maternal Grandmother 47       colon and uterine  . Cancer Maternal Grandfather        prostate  . Arthritis Paternal Grandmother   . Diabetes Paternal Grandmother   . Asthma Paternal Grandfather   . Depression Mother   . Cancer Maternal Aunt 46       breast  . Diabetes Maternal Uncle   . Mental illness Paternal Aunt  Social History Social History   Tobacco Use  . Smoking status: Former Smoker    Types: E-cigarettes    Quit date: 01/07/2017    Years since quitting: 2.5  . Smokeless tobacco: Never Used  . Tobacco comment: 1 pack would last 2-3 weeks  Substance Use Topics  . Alcohol use: No  . Drug use: No    Review of Systems  Constitutional: No fever/chills Eyes: No visual changes. ENT: No sore throat. Cardiovascular: Denies chest pain. Respiratory: Denies shortness of breath. Gastrointestinal: Positive for stomachache. Positive for nausea and vomiting.  No diarrhea.  No constipation. Genitourinary:  Negative for dysuria. Musculoskeletal: Negative for back pain. Skin: Positive for facial petechial rash Neurological: Negative for headaches, focal weakness or numbness.  ____________________________________________   PHYSICAL EXAM:  VITAL SIGNS: ED Triage Vitals  Enc Vitals Group     BP 08/12/19 1137 120/70     Pulse Rate 08/12/19 1137 81     Resp 08/12/19 1137 16     Temp 08/12/19 1137 97.9 F (36.6 C)     Temp Source 08/12/19 1137 Oral     SpO2 08/12/19 1137 98 %     Weight 08/12/19 1140 254 lb (115.2 kg)     Height 08/12/19 1140 5\' 6"  (1.676 m)     Head Circumference --      Peak Flow --      Pain Score 08/12/19 1139 10     Pain Loc --      Pain Edu? --      Excl. in GC? --     Constitutional: Alert and oriented. Well appearing and in no acute distress. Eyes: Conjunctivae are normal. PERRL. EOMI. Head: Atraumatic. Nose: No congestion/rhinnorhea. Mouth/Throat: Mucous membranes are moist.  Oropharynx non-erythematous. Neck: No stridor.   Hematological/Lymphatic/Immunilogical: No cervical lymphadenopathy. Cardiovascular: Normal rate, regular rhythm. Grossly normal heart sounds.  Good peripheral circulation. Respiratory: Normal respiratory effort.  No retractions. Lungs CTAB. Gastrointestinal: Soft and mild diffuse tenderness transverse lower abdomen. No distention. No abdominal bruits. No CVA tenderness.  Bowel sounds active and present x4. Genitourinary:  Musculoskeletal: No lower extremity tenderness nor edema.  No joint effusions. Neurologic:  Normal speech and language. No gross focal neurologic deficits are appreciated. No gait instability. Skin:  Skin is warm, dry and intact.  Scabbed lesion to left bottom lip.  Facial petechiae noted Psychiatric: Mood and affect are normal. Speech and behavior are normal.  ____________________________________________   LABS (all labs ordered are listed, but only abnormal results are displayed)  Labs Reviewed  CBC -  Abnormal; Notable for the following components:      Result Value   WBC 10.6 (*)    Hemoglobin 11.8 (*)    MCH 24.9 (*)    All other components within normal limits  URINALYSIS, COMPLETE (UACMP) WITH MICROSCOPIC - Abnormal; Notable for the following components:   Color, Urine AMBER (*)    APPearance CLOUDY (*)    Hgb urine dipstick LARGE (*)    Protein, ur 100 (*)    Nitrite POSITIVE (*)    Leukocytes,Ua LARGE (*)    RBC / HPF >50 (*)    WBC, UA >50 (*)    Bacteria, UA MANY (*)    All other components within normal limits  LIPASE, BLOOD  COMPREHENSIVE METABOLIC PANEL  POC URINE PREG, ED   ____________________________________________  EKG  Not indicated ____________________________________________  RADIOLOGY  ED MD interpretation:    Official radiology report(s): No results found.  ____________________________________________  PROCEDURES  Procedure(s) performed (including Critical Care):  Procedures  ____________________________________________   INITIAL IMPRESSION / ASSESSMENT AND PLAN   24 year old female presenting to the emergency department for evaluation of nausea and vomiting.  She is concerned that some of the jewelry cleaner that they use at work may have dripped into her water glass prior to her drinking from that glass.  If so, it was only a few drops.  Incident occurred more than 24 hours ago.  She does have some petechiae over her face, but states that she noticed that after she had several episodes of vomiting.   DIFFERENTIAL DIAGNOSIS  Viral gastroenteritis, colitis, COVID-19,   ED COURSE  Labs are reassuring except UA which is concerning for acute cystitis. Patient states that she doesn't have dysuria, but has noticed that her urine is darker than usual.   IV fluids infusing. She is currently nausea free and denies abdominal pain. Plan will be to discharge her home on Zofran and Bactrim once fluids are finished. She is to follow up with PCP  or return to the ER for symptoms of concern.  ____________________________________________   FINAL CLINICAL IMPRESSION(S) / ED DIAGNOSES  Final diagnoses:  Acute cystitis with hematuria     ED Discharge Orders         Ordered    ondansetron (ZOFRAN-ODT) 4 MG disintegrating tablet  Every 8 hours PRN     08/12/19 1252    sulfamethoxazole-trimethoprim (BACTRIM DS) 800-160 MG tablet  2 times daily     08/12/19 1252           BROCK MOKRY was evaluated in Emergency Department on 08/12/2019 for the symptoms described in the history of present illness. She was evaluated in the context of the global COVID-19 pandemic, which necessitated consideration that the patient might be at risk for infection with the SARS-CoV-2 virus that causes COVID-19. Institutional protocols and algorithms that pertain to the evaluation of patients at risk for COVID-19 are in a state of rapid change based on information released by regulatory bodies including the CDC and federal and state organizations. These policies and algorithms were followed during the patient's care in the ED.   Note:  This document was prepared using Dragon voice recognition software and may include unintentional dictation errors.   Chinita Pester, FNP 08/12/19 1327    Chesley Noon, MD 08/12/19 1450

## 2019-08-12 NOTE — ED Notes (Signed)
Pt assisted to toilet and provided UA cup and instructed on use for specimen collection. Visitor with pt at this time.

## 2019-08-12 NOTE — ED Notes (Signed)
Pt verbalized understanding of discharge instructions. NAD at this time. 

## 2019-08-12 NOTE — ED Notes (Signed)
Urine pregnancy negative at this time.

## 2019-08-12 NOTE — ED Triage Notes (Signed)
Pt states that she is worried that she may have come in contact with some jewelery cleaner, pt is not certain, but states that after drinking some of her drink while at work with vomiting, pt states that she has vomited approx 10 times since yesterday, pt cont to be nauseated, pt has a petechial rash to her face and also has a cold sore to her right lower lip that she stated was burning, reports hx of same but states has been a long time since she had one

## 2019-08-12 NOTE — ED Notes (Signed)
First Nurse Note: Pt to ED via POV, pt states that she drink something at work yesterday and thinks it may have had a chemical in it. Thinks she is having a reaction because she says her mouth is broke out. Pt is in NAD.

## 2019-08-13 LAB — POCT PREGNANCY, URINE: Preg Test, Ur: NEGATIVE

## 2019-10-01 ENCOUNTER — Ambulatory Visit: Payer: Managed Care, Other (non HMO) | Admitting: Family Medicine

## 2019-10-01 DIAGNOSIS — Z0289 Encounter for other administrative examinations: Secondary | ICD-10-CM

## 2020-02-01 ENCOUNTER — Other Ambulatory Visit: Payer: Self-pay

## 2020-02-01 ENCOUNTER — Encounter: Payer: Self-pay | Admitting: Emergency Medicine

## 2020-02-01 DIAGNOSIS — Z5321 Procedure and treatment not carried out due to patient leaving prior to being seen by health care provider: Secondary | ICD-10-CM | POA: Diagnosis not present

## 2020-02-01 DIAGNOSIS — F410 Panic disorder [episodic paroxysmal anxiety] without agoraphobia: Secondary | ICD-10-CM | POA: Insufficient documentation

## 2020-02-01 MED ORDER — ACETAMINOPHEN 325 MG PO TABS
650.0000 mg | ORAL_TABLET | Freq: Once | ORAL | Status: AC
  Administered 2020-02-01: 650 mg via ORAL
  Filled 2020-02-01: qty 2

## 2020-02-01 NOTE — ED Triage Notes (Signed)
Pt reports has been having panic attacks for about 4 days reports not able to sleep, feels tired. Pt talks in complete sentences no respiratory  distress noted.

## 2020-02-02 ENCOUNTER — Emergency Department
Admission: EM | Admit: 2020-02-02 | Discharge: 2020-02-02 | Disposition: A | Discharge status: Home / Self Care | Payer: Managed Care, Other (non HMO) | Attending: Emergency Medicine | Admitting: Emergency Medicine

## 2020-02-02 NOTE — ED Notes (Signed)
Patient called, no answer; patient not seen in lobby, bathroom, or outside.

## 2020-02-03 ENCOUNTER — Encounter: Payer: Self-pay | Admitting: Family Medicine

## 2020-02-26 ENCOUNTER — Ambulatory Visit (HOSPITAL_COMMUNITY)
Admission: RE | Admit: 2020-02-26 | Discharge: 2020-02-26 | Disposition: A | Discharge status: Home / Self Care | Payer: 59 | Attending: Psychiatry | Admitting: Psychiatry

## 2020-02-26 DIAGNOSIS — F419 Anxiety disorder, unspecified: Secondary | ICD-10-CM | POA: Diagnosis not present

## 2020-02-26 DIAGNOSIS — F329 Major depressive disorder, single episode, unspecified: Secondary | ICD-10-CM | POA: Diagnosis not present
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
## 2020-04-07 ENCOUNTER — Emergency Department
Admission: EM | Admit: 2020-04-07 | Discharge: 2020-04-07 | Disposition: A | Discharge status: Home / Self Care | Payer: Self-pay

## 2020-04-08 ENCOUNTER — Emergency Department (HOSPITAL_COMMUNITY): Payer: Managed Care, Other (non HMO)

## 2020-04-08 ENCOUNTER — Other Ambulatory Visit: Payer: Self-pay

## 2020-04-08 ENCOUNTER — Emergency Department (HOSPITAL_COMMUNITY)
Admission: EM | Admit: 2020-04-08 | Discharge: 2020-04-08 | Disposition: A | Discharge status: Home / Self Care | Payer: Managed Care, Other (non HMO) | Attending: Emergency Medicine | Admitting: Emergency Medicine

## 2020-04-08 ENCOUNTER — Encounter (HOSPITAL_COMMUNITY): Payer: Self-pay

## 2020-04-08 DIAGNOSIS — Z91040 Latex allergy status: Secondary | ICD-10-CM | POA: Diagnosis not present

## 2020-04-08 DIAGNOSIS — R059 Cough, unspecified: Secondary | ICD-10-CM | POA: Diagnosis present

## 2020-04-08 DIAGNOSIS — J069 Acute upper respiratory infection, unspecified: Secondary | ICD-10-CM | POA: Diagnosis not present

## 2020-04-08 DIAGNOSIS — Z87891 Personal history of nicotine dependence: Secondary | ICD-10-CM | POA: Diagnosis not present

## 2020-04-08 DIAGNOSIS — Z20822 Contact with and (suspected) exposure to covid-19: Secondary | ICD-10-CM | POA: Diagnosis not present

## 2020-04-08 LAB — TROPONIN I (HIGH SENSITIVITY)
Troponin I (High Sensitivity): <2 ng/L (ref <18)
Troponin I (High Sensitivity): <2 ng/L (ref <18)

## 2020-04-08 LAB — CBC
HCT: 41 % (ref 36.0–46.0)
Hemoglobin: 12.3 g/dL (ref 12.0–15.0)
MCH: 25.8 pg — ABNORMAL LOW (ref 26.0–34.0)
MCHC: 30 g/dL (ref 30.0–36.0)
MCV: 86 fL (ref 80.0–100.0)
Platelets: 357 10*3/uL (ref 150–400)
RBC: 4.77 MIL/uL (ref 3.87–5.11)
RDW: 15.3 % (ref 11.5–15.5)
WBC: 13 10*3/uL — ABNORMAL HIGH (ref 4.0–10.5)
nRBC: 0 % (ref 0.0–0.2)

## 2020-04-08 LAB — I-STAT BETA HCG BLOOD, ED (MC, WL, AP ONLY): I-stat hCG, quantitative: <5 m[IU]/mL (ref <5)

## 2020-04-08 LAB — BASIC METABOLIC PANEL
Anion gap: 10 (ref 5–15)
BUN: 7 mg/dL (ref 6–20)
CO2: 24 mmol/L (ref 22–32)
Calcium: 8.9 mg/dL (ref 8.9–10.3)
Chloride: 105 mmol/L (ref 98–111)
Creatinine, Ser: 0.74 mg/dL (ref 0.44–1.00)
GFR, Estimated: >60 mL/min (ref >60)
Glucose, Bld: 87 mg/dL (ref 70–99)
Potassium: 4.1 mmol/L (ref 3.5–5.1)
Sodium: 139 mmol/L (ref 135–145)

## 2020-04-08 LAB — RESPIRATORY PANEL BY RT PCR (FLU A&B, COVID)
Influenza A by PCR: NEGATIVE
Influenza B by PCR: NEGATIVE
SARS Coronavirus 2 by RT PCR: NEGATIVE

## 2020-04-08 LAB — D-DIMER, QUANTITATIVE: D-Dimer, Quant: 0.28 ug/mL-FEU (ref 0.00–0.50)

## 2020-04-08 LAB — HEPATIC FUNCTION PANEL
ALT: 14 U/L (ref 0–44)
AST: 22 U/L (ref 15–41)
Albumin: 3.7 g/dL (ref 3.5–5.0)
Alkaline Phosphatase: 80 U/L (ref 38–126)
Bilirubin, Direct: 0.2 mg/dL (ref 0.0–0.2)
Indirect Bilirubin: 0.4 mg/dL (ref 0.3–0.9)
Total Bilirubin: 0.6 mg/dL (ref 0.3–1.2)
Total Protein: 7.3 g/dL (ref 6.5–8.1)

## 2020-04-08 LAB — GROUP A STREP BY PCR: Group A Strep by PCR: NOT DETECTED

## 2020-04-08 LAB — MONONUCLEOSIS SCREEN: Mono Screen: NEGATIVE

## 2020-04-08 MED ORDER — ALBUTEROL SULFATE HFA 108 (90 BASE) MCG/ACT IN AERS
4.0000 | INHALATION_SPRAY | Freq: Once | RESPIRATORY_TRACT | Status: AC
  Administered 2020-04-08: 4 via RESPIRATORY_TRACT
  Filled 2020-04-08: qty 6.7

## 2020-04-08 NOTE — Discharge Instructions (Addendum)
You were evaluated in the Emergency Department and after careful evaluation, we did not find any emergent condition requiring admission or further testing in the hospital.  There was no evidence of pneumonia, blood clots, strep, or any other life-threatening cause of your symptoms today. Suspect that your symptoms are due to a virus. Please take over the counter mucinex, flonase, zyrtec, ibuprofen/tylenol as needed. You may also take the albuterol given to you here in the ER if you are wheezing.    Please return to the Emergency Department if you experience any worsening of your condition.  We encourage you to follow up with a primary care provider.  Thank you for allowing [place] to be a part of your care.

## 2020-04-08 NOTE — ED Provider Notes (Addendum)
[facility] EMERGENCY DEPARTMENT Provider Note   CSN: [id number] Arrival date & time: 04/08/20  1118     History Chief Complaint  Patient presents with  . Sore Throat  . Chest Pain    Vanessa Hester is a 24 y.o. female.  HPI 24 year old female with history of depression, ovarian cyst, anemia presents to the ER with complaints of productive cough with yellow sputum and occasional blood, low-grade fever, one episode of nausea, vomiting, sore throat.  Patient states that she feels like her "throat is closing up" and feels like she has to hold her throat in order to swallow.  She denies any drooling.  Denies any voice changes.  She states that she will sometimes cough up green phlegm, however does endorse coughing up "large amounts of blood at times".  At times she says her mucus does "half blood hal mucus".  She feels some chest tightness when she coughs and has had some occasional wheezing.  No prior history of asthma.  She is not vaccinated for Covid. No loss of taste or smell.   No other sick contacts at home.  Denies any abdominal pain.  She endorses some shortness of breath on exertion.  Denies any lower extremity swelling.  She uses the Nexplanon for birth control.  No recent surgeries or travels.  No prior history of PE.    Past Medical History:  Diagnosis Date  . Depression   . Febrile seizure (HCC) 10/10/14   When she was an infany  . Febrile seizure (HCC)   . Ovarian cyst     Patient Active Problem List   Diagnosis Date Noted  . Nexplanon in place 06/07/2018  . Cellulitis of foot, right 03/01/2018  . 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 Chlamydia infection 11/02/2016  . Ovarian cyst 07/22/2016  . Anemia 02/05/2015  . Depression 02/05/2015  . Menorrhagia 02/04/2015  . Fatigue 02/04/2015  . Tension headache 02/04/2015  . IBS (irritable bowel syndrome) 02/04/2015  . Vitamin D deficiency 02/04/2015  . Dysfunctional uterine bleeding 09/20/2013    Past  Surgical History:  Procedure Laterality Date  . KNEE SURGERY Left   . 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      OB History    Gravida  0   Para  0   Term  0   Preterm  0   AB  0   Living  0     SAB  0   TAB  0   Ectopic  0   Multiple  0   Live Births  0           Family History  Problem Relation Age of Onset  . Migraines Father   . Hypertension Father   . Cancer Maternal Grandmother 59       colon and uterine  . Cancer Maternal Grandfather        prostate  . Arthritis Paternal Grandmother   . Diabetes Paternal Grandmother   . Asthma Paternal Grandfather   . Depression Mother   . Cancer Maternal Aunt 40       breast  . Diabetes Maternal Uncle   . Mental illness Paternal Aunt     Social History   Tobacco Use  . Smoking status: Former Smoker    Types: E-cigarettes    Quit date: 01/07/2017    Years since quitting: 3.2  . Smokeless tobacco: Never Used  . Tobacco comment: 1 pack would  last 2-3 weeks  Vaping Use  . Vaping Use: Every day  Substance Use Topics  . Alcohol use: No  . Drug use: No    Home Medications Prior to Admission medications   Medication Sig Start Date End Date Taking? Authorizing Provider  acetaminophen (TYLENOL) 325 MG tablet Take 650 mg by mouth every 6 (six) hours as needed for mild pain.   Yes [provider]  ibuprofen (ADVIL) 200 MG tablet Take 400 mg by mouth every 6 (six) hours as needed for headache or cramping.   Yes [provider]  Cholecalciferol (VITAMIN D3) 1.25 MG (50000 UT) TABS Take 1 tablet by mouth every 7 (seven) days. Patient not taking: Reported on 04/08/2020 07/04/19   Emi Belfast, FNP  ibuprofen (ADVIL,MOTRIN) 800 MG tablet Take 1 tablet (800 mg total) by mouth every 8 (eight) hours as needed. Patient not taking: Reported on 04/08/2020 10/04/17   Faythe Ghee, PA-C  ondansetron (ZOFRAN-ODT) 4 MG disintegrating tablet Take 1 tablet (4 mg total) by mouth every 8  (eight) hours as needed for nausea or vomiting. Patient not taking: Reported on 04/08/2020 08/12/19   Kem Boroughs B, FNP  sertraline (ZOLOFT) 100 MG tablet Take 1 tablet (100 mg total) by mouth daily. Patient not taking: Reported on 04/08/2020 07/04/19   Emi Belfast, FNP  sulfamethoxazole-trimethoprim (BACTRIM DS) 800-160 MG tablet Take 1 tablet by mouth 2 (two) times daily. Patient not taking: Reported on 04/08/2020 08/12/19   Kem Boroughs B, FNP    Allergies    Other, Benadryl [diphenhydramine], Gardasil 9 [hpv 9-valent recomb vaccine], Amoxicillin, and Latex  Review of Systems   Review of Systems  Constitutional: Positive for chills, fatigue and fever.  HENT: Negative for ear pain and sore throat.   Eyes: Negative for pain and visual disturbance.  Respiratory: Positive for cough, shortness of breath and wheezing.   Cardiovascular: Positive for chest pain. Negative for palpitations.  Gastrointestinal: Positive for nausea and vomiting. Negative for abdominal pain and diarrhea.  Genitourinary: Negative for decreased urine volume, dysuria, hematuria, vaginal bleeding, vaginal discharge and vaginal pain.  Musculoskeletal: Negative for arthralgias and back pain.  Skin: Negative for color change and rash.  Neurological: Negative for seizures and syncope.  All other systems reviewed and are negative.   Physical Exam Updated Vital Signs BP (!) 139/94   Pulse 96   Temp 98.1 F (36.7 C)   Resp 15   SpO2 99%   Physical Exam Vitals and nursing note reviewed.  Constitutional:      General: She is not in acute distress.    Appearance: She is well-developed. She is not ill-appearing, toxic-appearing or diaphoretic.  HENT:     Head: Normocephalic and atraumatic.     Mouth/Throat:     Mouth: Mucous membranes are moist. No oral lesions.     Pharynx: Posterior oropharyngeal erythema present. No pharyngeal swelling, oropharyngeal exudate or uvula swelling.     Tonsils: No  tonsillar exudate or tonsillar abscesses. 1+ on the right. 1+ on the left.     Comments: Oropharynx mildy erythematous without exudates, uvula midline, no unilateral tonsillar swelling, tongue normal size and midline, no sublingual/submandibular swelling, tolerating secretions well    Eyes:     Extraocular Movements:     Right eye: Normal extraocular motion.     Conjunctiva/sclera: Conjunctivae normal.  Cardiovascular:     Rate and Rhythm: Normal rate and regular rhythm.     Pulses: Normal pulses.  Heart sounds: Murmur heard.   Pulmonary:     Effort: Pulmonary effort is normal. No respiratory distress.     Breath sounds: Normal breath sounds.     Comments: Slightly decreased breath sounds in lower lobes, scattered wheezes noted in the lower lobes as well. No rales or rhonchi  Abdominal:     Palpations: Abdomen is soft.     Tenderness: There is no abdominal tenderness.  Musculoskeletal:     Cervical back: Neck supple.  Skin:    General: Skin is warm and dry.     Capillary Refill: Capillary refill takes less than 2 seconds.  Neurological:     General: No focal deficit present.     Mental Status: She is alert and oriented to person, place, and time.     Sensory: No sensory deficit.     Motor: No weakness.  Psychiatric:        Mood and Affect: Mood normal.        Behavior: Behavior normal.     ED Results / Procedures / Treatments   Labs (all labs ordered are listed, but only abnormal results are displayed) Labs Reviewed  CBC - Abnormal; Notable for the following components:      Result Value   WBC 13.0 (*)    MCH 25.8 (*)    All other components within normal limits  RESPIRATORY PANEL BY RT PCR (FLU A&B, COVID)  GROUP A STREP BY PCR  BASIC METABOLIC PANEL  D-DIMER, QUANTITATIVE (NOT [facility])  MONONUCLEOSIS SCREEN  HEPATIC FUNCTION PANEL  I-STAT BETA HCG BLOOD, ED ([facility], [facility], [facility] ONLY)  TROPONIN I (HIGH SENSITIVITY)  TROPONIN I (HIGH SENSITIVITY)    EKG EKG  Interpretation  Date/Time:  Tuesday April 08 2020 11:55:53 EST Ventricular Rate:  80 PR Interval:  130 QRS Duration: 86 QT Interval:  344 QTC Calculation: 396 R Axis:   69 Text Interpretation: Sinus rhythm with marked sinus arrhythmia Otherwise normal ECG Nonspecific changes lead III in comparison to prior Confirmed by Alvira MondaySchlossman, Erin ([id number]) on 04/08/2020 3:13:11 PM   Radiology DG Chest Portable 1 View  Result Date: 04/08/2020 CLINICAL DATA:  Chest pain and congestion for several days, initial encounter EXAM: PORTABLE CHEST 1 VIEW COMPARISON:  04/27/2017 FINDINGS: The heart size and mediastinal contours are within normal limits. Both lungs are clear. The visualized skeletal structures are unremarkable. IMPRESSION: No active disease. Electronically Signed   By: Alcide CleverMark  Lukens M.D.   On: 04/08/2020 13:08    Procedures Procedures (including critical care time)  Medications Ordered in ED Medications  albuterol (VENTOLIN HFA) 108 (90 Base) MCG/ACT inhaler 4 puff (4 puffs Inhalation Given 04/08/20 1547)    ED Course  I have reviewed the triage vital signs and the nursing notes.  Pertinent labs & imaging results that were available during my care of the patient were reviewed by me and considered in my medical decision making (see chart for details).    MDM Rules/Calculators/A&P                         24 year old female with complaints of hemoptysis, sore throat, cough, malaise On presentation, she is alert, oriented, nontoxic-appearing, no acute distress, resting comfortably in ER bed.  Vitals on arrival overall reassuring, afebrile, not tachycardic tachypneic or hypoxic  The differential diagnosis includes COVID-19, viral URI, PE, strep pharyngitis, mono, ACS, pneumonia  I question the patient multiple times about hemoptysis and she several times endorse that she  has been coughing up large amounts of blood.  Labs ordered, reviewed and interpreted by me -BMP without any  electrode abnormalities, normal renal function.  Normal anion gap -Negative pregnancy -CBC with a leukocytosis of 13, normal hemoglobin -Delta troponin negative -D-dimer is negative -Mono negative -Covid negative -Strep negative -Normal hepatic function panel  Imaging reviewed by myself and radiology -Chest x-ray without any evidence of infection or acute abnormality  EKG reviewed by myself my supervising physician -Some nonspecific changes in lead III noted, however no significant evidence of ischemia or arrhythmia  Medications:  treatment included albuterol for wheezing.  MDM: Patient's work-up overall reassuring. No evidence of pneumonia, PE, mono, strep, airways patent, patient requested fluids and is tolerating fluids well. Suspect viral source. Patient was educated on supportive measures. Return precautions discussed. Encouraged PCP follow-up. She voiced understanding and is agreeable. At this stage in the ED course, the patient is medically screened and stable for discharge   Final Clinical Impression(s) / ED Diagnoses Final diagnoses:  Viral URI with cough    Rx / DC Orders ED Discharge Orders    None          Mare Ferrari, PA-C 04/08/20 1747    Alvira Monday, MD 04/09/20 1103

## 2020-04-08 NOTE — ED Notes (Signed)
Patient verbalizes understanding of discharge instructions. Opportunity for questioning and answers were provided. Armband removed by staff, pt discharged from ED ambulatory to home.  

## 2020-04-08 NOTE — ED Triage Notes (Signed)
Pt reports sore throat, congestion and chest pain since the weekend. Pt also reports n.v. Pt has not been vaccinated for COVID or flu.

## 2020-04-23 ENCOUNTER — Other Ambulatory Visit: Payer: Self-pay

## 2020-04-23 ENCOUNTER — Ambulatory Visit (INDEPENDENT_AMBULATORY_CARE_PROVIDER_SITE_OTHER): Payer: Managed Care, Other (non HMO) | Admitting: Advanced Practice Midwife

## 2020-04-23 ENCOUNTER — Encounter: Payer: Self-pay | Admitting: Advanced Practice Midwife

## 2020-04-23 VITALS — BP 124/78 | HR 82 | Wt 239.0 lb

## 2020-04-23 DIAGNOSIS — Z3046 Encounter for surveillance of implantable subdermal contraceptive: Secondary | ICD-10-CM | POA: Diagnosis not present

## 2020-04-23 NOTE — Patient Instructions (Signed)

## 2020-04-23 NOTE — Progress Notes (Signed)
     GYNECOLOGY OFFICE PROCEDURE NOTE  Vanessa Hester is a 24 y.o. G0P0000 here for Nexplanon removal.  Last pap smear was on 06/07/18 and was normal.  No other gynecologic concerns.  Nexplanon Removal Patient identified, informed consent performed, consent signed.   Appropriate time out taken. Nexplanon site identified.  Area prepped in usual sterile fashon. Three mls of 1% lidocaine with epi. was used to anesthetize the area at the distal end of the implant. A small stab incision was made right beside the implant on the distal portion.  The Nexplanon rod was grasped using hemostats and removed without difficulty.  There was minimal blood loss. There were no complications.  Steri-strips were applied over the small incision.  A pressure bandage was applied to reduce any bruising.  The patient tolerated the procedure well and was given post procedure instructions.  Patient is planning to use condoms for contraception until she decides if she wants a pregnancy at this time or another form of birth control. Instructions given regarding BC options. She will RTC as needed.   Analeise Mccleery Cochrane, RN, FNP, [facility] [employer] manager, Biochemist, clinical for [employer], [employer]

## 2020-06-03 ENCOUNTER — Encounter: Payer: Self-pay | Admitting: Radiology

## 2020-10-28 ENCOUNTER — Emergency Department (HOSPITAL_COMMUNITY)
Admission: EM | Admit: 2020-10-28 | Discharge: 2020-10-29 | Disposition: A | Discharge status: Home / Self Care | Payer: Managed Care, Other (non HMO) | Attending: Emergency Medicine | Admitting: Emergency Medicine

## 2020-10-28 ENCOUNTER — Encounter (HOSPITAL_COMMUNITY): Payer: Self-pay

## 2020-10-28 DIAGNOSIS — Z87891 Personal history of nicotine dependence: Secondary | ICD-10-CM | POA: Diagnosis not present

## 2020-10-28 DIAGNOSIS — Z91040 Latex allergy status: Secondary | ICD-10-CM | POA: Diagnosis not present

## 2020-10-28 DIAGNOSIS — L089 Local infection of the skin and subcutaneous tissue, unspecified: Secondary | ICD-10-CM | POA: Insufficient documentation

## 2020-10-28 DIAGNOSIS — R21 Rash and other nonspecific skin eruption: Secondary | ICD-10-CM | POA: Diagnosis present

## 2020-10-28 LAB — BASIC METABOLIC PANEL
Anion gap: 8 (ref 5–15)
BUN: 5 mg/dL — ABNORMAL LOW (ref 6–20)
CO2: 27 mmol/L (ref 22–32)
Calcium: 8.7 mg/dL — ABNORMAL LOW (ref 8.9–10.3)
Chloride: 103 mmol/L (ref 98–111)
Creatinine, Ser: 0.75 mg/dL (ref 0.44–1.00)
GFR, Estimated: >60 mL/min (ref >60)
Glucose, Bld: 93 mg/dL (ref 70–99)
Potassium: 3.3 mmol/L — ABNORMAL LOW (ref 3.5–5.1)
Sodium: 138 mmol/L (ref 135–145)

## 2020-10-28 LAB — CBC WITH DIFFERENTIAL/PLATELET
Abs Immature Granulocytes: 0.04 10*3/uL (ref 0.00–0.07)
Basophils Absolute: 0 10*3/uL (ref 0.0–0.1)
Basophils Relative: 0 %
Eosinophils Absolute: 0.2 10*3/uL (ref 0.0–0.5)
Eosinophils Relative: 3 %
HCT: 36.7 % (ref 36.0–46.0)
Hemoglobin: 11.1 g/dL — ABNORMAL LOW (ref 12.0–15.0)
Immature Granulocytes: 1 %
Lymphocytes Relative: 21 %
Lymphs Abs: 1.7 10*3/uL (ref 0.7–4.0)
MCH: 23.8 pg — ABNORMAL LOW (ref 26.0–34.0)
MCHC: 30.2 g/dL (ref 30.0–36.0)
MCV: 78.6 fL — ABNORMAL LOW (ref 80.0–100.0)
Monocytes Absolute: 0.7 10*3/uL (ref 0.1–1.0)
Monocytes Relative: 9 %
Neutro Abs: 5.1 10*3/uL (ref 1.7–7.7)
Neutrophils Relative %: 66 %
Platelets: 399 10*3/uL (ref 150–400)
RBC: 4.67 MIL/uL (ref 3.87–5.11)
RDW: 16.7 % — ABNORMAL HIGH (ref 11.5–15.5)
WBC: 7.8 10*3/uL (ref 4.0–10.5)
nRBC: 0 % (ref 0.0–0.2)

## 2020-10-28 NOTE — ED Provider Notes (Signed)
Emergency Medicine Provider Triage Evaluation Note  Vanessa Hester , a 25 y.o. female  was evaluated in triage.  Pt complains of rash.  Pt wants hiv test    Review of Systems  Positive: Negative:   Physical Exam  BP 123/82 (BP Location: Right Arm)   Pulse 95   Temp 98.6 F (37 C) (Oral)   Resp 16   SpO2 95%  Gen:   Awake, no distress   Resp:  Normal effort  MSK:   Moves extremities without difficulty  Other:  Multiple sores full body   Medical Decision Making  Medically screening exam initiated at 9:53 PM.  Appropriate orders placed.  Vanessa Hester was informed that the remainder of the evaluation will be completed by another provider, this initial triage assessment does not replace that evaluation, and the importance of remaining in the ED until their evaluation is complete.     Elson Areas, Cordelia Poche 10/28/20 2154    Gwyneth Sprout, MD 10/31/20 1353

## 2020-10-28 NOTE — ED Triage Notes (Signed)
Pt states that she broke out in a rash about a week ago, scabs all over body, wants HIV testing, no itching

## 2020-10-29 LAB — HIV ANTIBODY (ROUTINE TESTING W REFLEX): HIV Screen 4th Generation wRfx: NONREACTIVE

## 2020-10-29 MED ORDER — SULFAMETHOXAZOLE-TRIMETHOPRIM 800-160 MG PO TABS: 1.0000 | ORAL_TABLET | Freq: Two times a day (BID) | ORAL | 0 refills | Status: AC

## 2020-10-29 NOTE — Discharge Instructions (Addendum)
Take the prescribed medication as directed.  Home wound care with soap and warm water best you can.   Follow-up with dermatologist--- I have listed a couple of local offices that you can try. Return to the ED for new or worsening symptoms.

## 2020-10-29 NOTE — ED Provider Notes (Signed)
[facility] EMERGENCY DEPARTMENT Provider Note   CSN: [id number] Arrival date & time: 10/28/20  2106     History Chief Complaint  Patient presents with   Rash    Vanessa Hester is a 25 y.o. female.  The history is provided by the patient and medical records.  Rash   25 y.o. F here with rash.  States present for a few days now.  States it started as small dots, not progressed to scabs all across her torso and upper extremities.  Not too much on lower extremities.  She denies it being itchy or particularly painful.  She has not had any fever, chills.  Was concerned because told by a friend it looked like HIV.  Recently moved to [place], does not have dermatologist here.  Past Medical History:  Diagnosis Date   Depression    Febrile seizure (HCC) 10/10/14   When she was an infany   Febrile seizure (HCC)    Ovarian cyst     Patient Active Problem List   Diagnosis Date Noted   Nexplanon in place 06/07/2018   Cellulitis of foot, right 03/01/2018   History of PID 11/05/2016   Chlamydia infection 11/02/2016   Ovarian cyst 07/22/2016   Anemia 02/05/2015   Depression 02/05/2015   Menorrhagia 02/04/2015   Fatigue 02/04/2015   Tension headache 02/04/2015   IBS (irritable bowel syndrome) 02/04/2015   Vitamin D deficiency 02/04/2015   Dysfunctional uterine bleeding 09/20/2013    Past Surgical History:  Procedure Laterality Date   KNEE SURGERY Left    TUMOR REMOVAL  10/10/14   tumor removed from left knee.      OB History     Gravida  0   Para  0   Term  0   Preterm  0   AB  0   Living  0      SAB  0   IAB  0   Ectopic  0   Multiple  0   Live Births  0           Family History  Problem Relation Age of Onset   Migraines Father    Hypertension Father    Cancer Maternal Grandmother 35       colon and uterine   Cancer Maternal Grandfather        prostate   Arthritis Paternal Grandmother    Diabetes Paternal Grandmother     Asthma Paternal Grandfather    Depression Mother    Cancer Maternal Aunt 21       breast   Diabetes Maternal Uncle    Mental illness Paternal Aunt     Social History   Tobacco Use   Smoking status: Former    Pack years: 0.00    Types: E-cigarettes    Quit date: 01/07/2017    Years since quitting: 3.8   Smokeless tobacco: Never   Tobacco comments:    1 pack would last 2-3 weeks  Vaping Use   Vaping Use: Every day  Substance Use Topics   Alcohol use: No   Drug use: No    Home Medications Prior to Admission medications   Medication Sig Start Date End Date Taking? Authorizing Provider  acetaminophen (TYLENOL) 325 MG tablet Take 650 mg by mouth every 6 (six) hours as needed for mild pain.    [provider]  Cholecalciferol (VITAMIN D3) 1.25 MG (50000 UT) TABS Take 1 tablet by mouth every 7 (seven) days. Patient  not taking: Reported on 04/08/2020 07/04/19   Emi Belfast, FNP  ibuprofen (ADVIL) 200 MG tablet Take 400 mg by mouth every 6 (six) hours as needed for headache or cramping.    [provider]  ibuprofen (ADVIL,MOTRIN) 800 MG tablet Take 1 tablet (800 mg total) by mouth every 8 (eight) hours as needed. Patient not taking: Reported on 04/08/2020 10/04/17   Faythe Ghee, PA-C  ondansetron (ZOFRAN-ODT) 4 MG disintegrating tablet Take 1 tablet (4 mg total) by mouth every 8 (eight) hours as needed for nausea or vomiting. Patient not taking: Reported on 04/08/2020 08/12/19   Kem Boroughs B, FNP  sertraline (ZOLOFT) 100 MG tablet Take 1 tablet (100 mg total) by mouth daily. Patient not taking: Reported on 04/08/2020 07/04/19   Emi Belfast, FNP  sulfamethoxazole-trimethoprim (BACTRIM DS) 800-160 MG tablet Take 1 tablet by mouth 2 (two) times daily. Patient not taking: Reported on 04/08/2020 08/12/19   Kem Boroughs B, FNP    Allergies    Other, Benadryl [diphenhydramine], Gardasil 9 [hpv 9-valent recomb vaccine], Amoxicillin, and Latex  Review  of Systems   Review of Systems  Skin:  Positive for rash.  All other systems reviewed and are negative.  Physical Exam Updated Vital Signs BP 133/78   Pulse 78   Temp 98.6 F (37 C) (Oral)   Resp 17   SpO2 98%   Physical Exam Vitals and nursing note reviewed.  Constitutional:      Appearance: She is well-developed.  HENT:     Head: Normocephalic and atraumatic.  Eyes:     Conjunctiva/sclera: Conjunctivae normal.     Pupils: Pupils are equal, round, and reactive to light.  Cardiovascular:     Rate and Rhythm: Normal rate and regular rhythm.     Heart sounds: Normal heart sounds.  Pulmonary:     Effort: Pulmonary effort is normal. No respiratory distress.     Breath sounds: Normal breath sounds. No rhonchi.  Abdominal:     General: Bowel sounds are normal.     Palpations: Abdomen is soft.     Tenderness: There is no abdominal tenderness. There is no rebound.  Musculoskeletal:        General: Normal range of motion.     Cervical back: Normal range of motion.  Skin:    General: Skin is warm and dry.     Comments: Scabs present across upper extremities, chest, back, and abdomen, limited on legs, signs of excoriation noted, no active drainage, no cellulitic changes or surrounding skin, no lesions on palms/soles  Neurological:     Mental Status: She is alert and oriented to person, place, and time.    ED Results / Procedures / Treatments   Labs (all labs ordered are listed, but only abnormal results are displayed) Labs Reviewed  CBC WITH DIFFERENTIAL/PLATELET - Abnormal; Notable for the following components:      Result Value   Hemoglobin 11.1 (*)    MCV 78.6 (*)    MCH 23.8 (*)    RDW 16.7 (*)    All other components within normal limits  BASIC METABOLIC PANEL - Abnormal; Notable for the following components:   Potassium 3.3 (*)    BUN 5 (*)    Calcium 8.7 (*)    All other components within normal limits  HIV ANTIBODY (ROUTINE TESTING W REFLEX)     EKG None  Radiology No results found.  Procedures Procedures   Medications Ordered in ED Medications - No  data to display  ED Course  I have reviewed the triage vital signs and the nursing notes.  Pertinent labs & imaging results that were available during my care of the patient were reviewed by me and considered in my medical decision making (see chart for details).    MDM Rules/Calculators/A&P  25 y.o. F here with scabs all over her body for the past several days.  Does seem more concentrated on upper extremities and torso.  No lesions on palms/soles.  Some signs of excoriation but no cellulitic changes on exam.  She has no hx of DM or IVDU.  Her vitals are stable and labs are reassuring.  HIV test is negative.  No lesions on palms/soles so lower suspicion for syphilis.  Exam findings are most consistent with MRSA infection.  Will start on course of abx and refer to dermatology for further evaluation.  Return here for new concerns.  Final Clinical Impression(s) / ED Diagnoses Final diagnoses:  Skin infection    Rx / DC Orders ED Discharge Orders          Ordered    sulfamethoxazole-trimethoprim (BACTRIM DS) 800-160 MG tablet  2 times daily        10/29/20 0316             Garlon Hatchet, PA-C 10/29/20 0320    Milagros Loll, MD 10/30/20 [phone]

## 2020-11-02 ENCOUNTER — Ambulatory Visit (HOSPITAL_COMMUNITY): Payer: Managed Care, Other (non HMO)

## 2020-11-10 ENCOUNTER — Ambulatory Visit (HOSPITAL_COMMUNITY): Admit: 2020-11-10 | Disposition: A | Discharge status: Home / Self Care | Payer: Managed Care, Other (non HMO)

## 2020-11-28 ENCOUNTER — Emergency Department (HOSPITAL_COMMUNITY)
Admission: EM | Admit: 2020-11-28 | Discharge: 2020-11-28 | Disposition: A | Discharge status: Home / Self Care | Payer: Managed Care, Other (non HMO) | Attending: Emergency Medicine | Admitting: Emergency Medicine

## 2020-11-28 ENCOUNTER — Other Ambulatory Visit: Payer: Self-pay

## 2020-11-28 DIAGNOSIS — J029 Acute pharyngitis, unspecified: Secondary | ICD-10-CM | POA: Diagnosis present

## 2020-11-28 DIAGNOSIS — J020 Streptococcal pharyngitis: Secondary | ICD-10-CM | POA: Insufficient documentation

## 2020-11-28 DIAGNOSIS — Z87891 Personal history of nicotine dependence: Secondary | ICD-10-CM | POA: Insufficient documentation

## 2020-11-28 DIAGNOSIS — H1032 Unspecified acute conjunctivitis, left eye: Secondary | ICD-10-CM | POA: Insufficient documentation

## 2020-11-28 DIAGNOSIS — L0100 Impetigo, unspecified: Secondary | ICD-10-CM | POA: Diagnosis not present

## 2020-11-28 DIAGNOSIS — R1310 Dysphagia, unspecified: Secondary | ICD-10-CM | POA: Diagnosis not present

## 2020-11-28 DIAGNOSIS — Z20822 Contact with and (suspected) exposure to covid-19: Secondary | ICD-10-CM | POA: Insufficient documentation

## 2020-11-28 DIAGNOSIS — Z91040 Latex allergy status: Secondary | ICD-10-CM | POA: Insufficient documentation

## 2020-11-28 LAB — COMPREHENSIVE METABOLIC PANEL
ALT: 13 U/L (ref 0–44)
AST: 19 U/L (ref 15–41)
Albumin: 3.1 g/dL — ABNORMAL LOW (ref 3.5–5.0)
Alkaline Phosphatase: 63 U/L (ref 38–126)
Anion gap: 9 (ref 5–15)
BUN: 6 mg/dL (ref 6–20)
CO2: 24 mmol/L (ref 22–32)
Calcium: 8.8 mg/dL — ABNORMAL LOW (ref 8.9–10.3)
Chloride: 102 mmol/L (ref 98–111)
Creatinine, Ser: 0.82 mg/dL (ref 0.44–1.00)
GFR, Estimated: >60 mL/min (ref >60)
Glucose, Bld: 98 mg/dL (ref 70–99)
Potassium: 3.8 mmol/L (ref 3.5–5.1)
Sodium: 135 mmol/L (ref 135–145)
Total Bilirubin: 0.5 mg/dL (ref 0.3–1.2)
Total Protein: 7.2 g/dL (ref 6.5–8.1)

## 2020-11-28 LAB — CBC WITH DIFFERENTIAL/PLATELET
Abs Immature Granulocytes: 0.05 10*3/uL (ref 0.00–0.07)
Basophils Absolute: 0 10*3/uL (ref 0.0–0.1)
Basophils Relative: 0 %
Eosinophils Absolute: 0 10*3/uL (ref 0.0–0.5)
Eosinophils Relative: 0 %
HCT: 36.3 % (ref 36.0–46.0)
Hemoglobin: 11.3 g/dL — ABNORMAL LOW (ref 12.0–15.0)
Immature Granulocytes: 1 %
Lymphocytes Relative: 11 %
Lymphs Abs: 1.1 10*3/uL (ref 0.7–4.0)
MCH: 23.8 pg — ABNORMAL LOW (ref 26.0–34.0)
MCHC: 31.1 g/dL (ref 30.0–36.0)
MCV: 76.4 fL — ABNORMAL LOW (ref 80.0–100.0)
Monocytes Absolute: 1 10*3/uL (ref 0.1–1.0)
Monocytes Relative: 10 %
Neutro Abs: 8.5 10*3/uL — ABNORMAL HIGH (ref 1.7–7.7)
Neutrophils Relative %: 78 %
Platelets: 398 10*3/uL (ref 150–400)
RBC: 4.75 MIL/uL (ref 3.87–5.11)
RDW: 16.3 % — ABNORMAL HIGH (ref 11.5–15.5)
WBC: 10.7 10*3/uL — ABNORMAL HIGH (ref 4.0–10.5)
nRBC: 0 % (ref 0.0–0.2)

## 2020-11-28 LAB — RESP PANEL BY RT-PCR (FLU A&B, COVID) ARPGX2
Influenza A by PCR: NEGATIVE
Influenza B by PCR: NEGATIVE
SARS Coronavirus 2 by RT PCR: NEGATIVE

## 2020-11-28 LAB — GROUP A STREP BY PCR: Group A Strep by PCR: DETECTED — AB

## 2020-11-28 LAB — MONONUCLEOSIS SCREEN: Mono Screen: NEGATIVE

## 2020-11-28 MED ORDER — CLINDAMYCIN HCL 150 MG PO CAPS: 300.0000 mg | ORAL_CAPSULE | Freq: Four times a day (QID) | ORAL | 0 refills | Status: DC

## 2020-11-28 MED ORDER — FLUORESCEIN SODIUM 1 MG OP STRP
1.0000 | ORAL_STRIP | Freq: Once | OPHTHALMIC | Status: AC
  Administered 2020-11-28: 1 via OPHTHALMIC
  Filled 2020-11-28: qty 1

## 2020-11-28 MED ORDER — SODIUM CHLORIDE 0.9 % IV BOLUS
1000.0000 mL | Freq: Once | INTRAVENOUS | Status: AC
  Administered 2020-11-28: 1000 mL via INTRAVENOUS

## 2020-11-28 MED ORDER — CLINDAMYCIN PHOSPHATE 600 MG/50ML IV SOLN
600.0000 mg | Freq: Once | INTRAVENOUS | Status: AC
  Administered 2020-11-28: 600 mg via INTRAVENOUS
  Filled 2020-11-28: qty 50

## 2020-11-28 MED ORDER — DEXAMETHASONE SODIUM PHOSPHATE 10 MG/ML IJ SOLN
10.0000 mg | Freq: Once | INTRAMUSCULAR | Status: AC
  Administered 2020-11-28: 10 mg via INTRAVENOUS
  Filled 2020-11-28: qty 1

## 2020-11-28 MED ORDER — TETRACAINE HCL 0.5 % OP SOLN
2.0000 [drp] | Freq: Once | OPHTHALMIC | Status: AC
  Administered 2020-11-28: 2 [drp] via OPHTHALMIC
  Filled 2020-11-28: qty 4

## 2020-11-28 MED ORDER — POLYMYXIN B-TRIMETHOPRIM 10000-0.1 UNIT/ML-% OP SOLN
1.0000 [drp] | OPHTHALMIC | Status: DC
  Administered 2020-11-28: 1 [drp] via OPHTHALMIC
  Filled 2020-11-28: qty 10

## 2020-11-28 NOTE — ED Provider Notes (Signed)
[facility] EMERGENCY DEPARTMENT Provider Note   CSN: [id number] Arrival date & time: 11/28/20  0003     History Chief Complaint  Patient presents with   Cough   Sore Throat    Vanessa Hester is a 25 y.o. female.  Patient presents to the emergency department for evaluation of multiple problems.  Patient reports that she has been experiencing cough and sore throat for several weeks.  Additionally she has been experiencing redness, drainage and blurriness of the left eye.  Patient thinks she has had fever and chills over the last week.  Throat feels swollen.  Patient reports pain with swallowing.  She has a persistent rash that she has been seen for previously.  She reports that her roommate is concerned that she (the patient) has monkey pox.      Past Medical History:  Diagnosis Date   Depression    Febrile seizure (HCC) 10/10/14   When she was an infany   Febrile seizure (HCC)    Ovarian cyst     Patient Active Problem List   Diagnosis Date Noted   Nexplanon in place 06/07/2018   Cellulitis of foot, right 03/01/2018   History of PID 11/05/2016   Chlamydia infection 11/02/2016   Ovarian cyst 07/22/2016   Anemia 02/05/2015   Depression 02/05/2015   Menorrhagia 02/04/2015   Fatigue 02/04/2015   Tension headache 02/04/2015   IBS (irritable bowel syndrome) 02/04/2015   Vitamin D deficiency 02/04/2015   Dysfunctional uterine bleeding 09/20/2013    Past Surgical History:  Procedure Laterality Date   KNEE SURGERY Left    TUMOR REMOVAL  10/10/14   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      OB History     Gravida  0   Para  0   Term  0   Preterm  0   AB  0   Living  0      SAB  0   IAB  0   Ectopic  0   Multiple  0   Live Births  0           Family History  Problem Relation Age of Onset   Migraines Father    Hypertension Father    Cancer Maternal Grandmother 20       colon and uterine   Cancer Maternal Grandfather         prostate   Arthritis Paternal Grandmother    Diabetes Paternal Grandmother    Asthma Paternal Grandfather    Depression Mother    Cancer Maternal Aunt 28       breast   Diabetes Maternal Uncle    Mental illness Paternal Aunt     Social History   Tobacco Use   Smoking status: Former    Types: E-cigarettes    Quit date: 01/07/2017    Years since quitting: 3.8   Smokeless tobacco: Never   Tobacco comments:    1 pack would last 2-3 weeks  Vaping Use   Vaping Use: Every day  Substance Use Topics   Alcohol use: No   Drug use: No    Home Medications Prior to Admission medications   Medication Sig Start Date End Date Taking? Authorizing Provider  clindamycin (CLEOCIN) 150 MG capsule Take 2 capsules (300 mg total) by mouth 4 (four) times daily. 11/28/20  Yes Ellisa Devivo, Canary Brim, MD  acetaminophen (TYLENOL) 325 MG tablet Take 650 mg by mouth every 6 (six) hours as needed for mild  pain.    [provider]  Cholecalciferol (VITAMIN D3) 1.25 MG (50000 UT) TABS Take 1 tablet by mouth every 7 (seven) days. Patient not taking: Reported on 04/08/2020 07/04/19   Emi BelfastGessner, Deborah B, FNP  ibuprofen (ADVIL) 200 MG tablet Take 400 mg by mouth every 6 (six) hours as needed for headache or cramping.    [provider]  ibuprofen (ADVIL,MOTRIN) 800 MG tablet Take 1 tablet (800 mg total) by mouth every 8 (eight) hours as needed. Patient not taking: Reported on 04/08/2020 10/04/17   Faythe GheeFisher, Susan W, PA-C  ondansetron (ZOFRAN-ODT) 4 MG disintegrating tablet Take 1 tablet (4 mg total) by mouth every 8 (eight) hours as needed for nausea or vomiting. Patient not taking: Reported on 04/08/2020 08/12/19   Kem Boroughsriplett, Cari B, FNP  sertraline (ZOLOFT) 100 MG tablet Take 1 tablet (100 mg total) by mouth daily. Patient not taking: Reported on 04/08/2020 07/04/19   Emi BelfastGessner, Deborah B, FNP    Allergies    Other, Benadryl [diphenhydramine], Gardasil 9 [hpv 9-valent recomb vaccine], Amoxicillin, and  Latex  Review of Systems   Review of Systems  Constitutional:  Positive for chills and fever.  HENT:  Positive for sore throat and voice change.   Eyes:  Positive for redness.  Respiratory:  Positive for cough.   All other systems reviewed and are negative.  Physical Exam Updated Vital Signs BP (!) 142/70   Pulse (!) 124   Temp 99.7 F (37.6 C) (Oral)   Resp (!) 22   SpO2 98%   Physical Exam Vitals and nursing note reviewed.  Constitutional:      General: She is not in acute distress.    Appearance: Normal appearance. She is well-developed.  HENT:     Head: Normocephalic and atraumatic.     Right Ear: Hearing normal.     Left Ear: Hearing normal.     Nose: Nose normal.     Mouth/Throat:     Pharynx: Pharyngeal swelling and posterior oropharyngeal erythema present.     Tonsils: Tonsillar exudate (slight) present. 4+ on the right. 4+ on the left.  Eyes:     General: Lids are normal. Vision grossly intact. Gaze aligned appropriately.     Intraocular pressure: Left eye pressure is 21 mmHg. Measurements were taken using an automated tonometer.    Extraocular Movements: Extraocular movements intact.     Conjunctiva/sclera:     Left eye: Left conjunctiva is injected. No chemosis, exudate or hemorrhage.    Pupils: Pupils are equal, round, and reactive to light.     Left eye: No fluorescein uptake.  Cardiovascular:     Rate and Rhythm: Regular rhythm.     Heart sounds: S1 normal and S2 normal. No murmur heard.   No friction rub. No gallop.  Pulmonary:     Effort: Pulmonary effort is normal. No respiratory distress.     Breath sounds: Normal breath sounds.  Chest:     Chest wall: No tenderness.  Abdominal:     General: Bowel sounds are normal.     Palpations: Abdomen is soft.     Tenderness: There is no abdominal tenderness. There is no guarding or rebound. Negative signs include Murphy's sign and McBurney's sign.     Hernia: No hernia is present.  Musculoskeletal:         General: Normal range of motion.     Cervical back: Normal range of motion and neck supple.  Skin:    General: Skin is  warm and dry.     Findings: Rash (scabs all over) present.  Neurological:     Mental Status: She is alert and oriented to person, place, and time.     GCS: GCS eye subscore is 4. GCS verbal subscore is 5. GCS motor subscore is 6.     Cranial Nerves: No cranial nerve deficit.     Sensory: No sensory deficit.     Coordination: Coordination normal.  Psychiatric:        Speech: Speech normal.        Behavior: Behavior normal.        Thought Content: Thought content normal.    ED Results / Procedures / Treatments   Labs (all labs ordered are listed, but only abnormal results are displayed) Labs Reviewed  GROUP A STREP BY PCR - Abnormal; Notable for the following components:      Result Value   Group A Strep by PCR DETECTED (*)    All other components within normal limits  CBC WITH DIFFERENTIAL/PLATELET - Abnormal; Notable for the following components:   WBC 10.7 (*)    Hemoglobin 11.3 (*)    MCV 76.4 (*)    MCH 23.8 (*)    RDW 16.3 (*)    Neutro Abs 8.5 (*)    All other components within normal limits  COMPREHENSIVE METABOLIC PANEL - Abnormal; Notable for the following components:   Calcium 8.8 (*)    Albumin 3.1 (*)    All other components within normal limits  RESP PANEL BY RT-PCR (FLU A&B, COVID) ARPGX2  MONONUCLEOSIS SCREEN  GC/CHLAMYDIA PROBE AMP ([place]) NOT AT [facility]    EKG None  Radiology No results found.  Procedures Procedures   Medications Ordered in ED Medications  clindamycin (CLEOCIN) IVPB 600 mg (has no administration in time range)  sodium chloride 0.9 % bolus 1,000 mL (1,000 mLs Intravenous New Bag/Given 11/28/20 0415)  dexamethasone (DECADRON) injection 10 mg (10 mg Intravenous Given 11/28/20 0413)  tetracaine (PONTOCAINE) 0.5 % ophthalmic solution 2 drop (2 drops Left Eye Given 11/28/20 0454)  fluorescein ophthalmic strip 1 strip  (1 strip Left Eye Given 11/28/20 0455)    ED Course  I have reviewed the triage vital signs and the nursing notes.  Pertinent labs & imaging results that were available during my care of the patient were reviewed by me and considered in my medical decision making (see chart for details).    MDM Rules/Calculators/A&P                          \Patient presents to the emergency department for evaluation of multiple problems.  Patient has had persistent rash that she has been seen previously for.  At previous visit it was felt that this was likely an infection and she was treated with Bactrim.  Symptoms have not improved.  Patient with Non-purulent scabs scattered over her body.  No associated induration.  Etiology of the rash is unclear, it did not seem to respond to the Bactrim prescribed 1 month ago.  Patient complaining of sore throat and left eye irritation.  Eye exam reveals conjunctivitis without any other concerning findings.  We will treat with Bleph-10 drops.  Thorough examination reveals diffuse erythema and edema of the posterior oropharynx after she went tonsils.  Minimal exudate noted.  No sign of abscess.  Strep test is positive.  Infection possibly strep impetigo.  Will treat with antibiotics.  Patient administered IV fluids  and Decadron in the department.  Final Clinical Impression(s) / ED Diagnoses Final diagnoses:  Strep pharyngitis  Acute conjunctivitis of left eye, unspecified acute conjunctivitis type  Impetigo    Rx / DC Orders ED Discharge Orders          Ordered    clindamycin (CLEOCIN) 150 MG capsule  4 times daily        11/28/20 0600             Mart Colpitts, Canary Brim, MD 11/28/20 [phone]

## 2020-11-28 NOTE — ED Triage Notes (Signed)
Pt c/o cough, sore throat since previously seen 6/14, redness in L eye, blurriness x3wks, was advised by EMS that eval unnecessary, wants eval anyway. Endorses fever, chills, states she's woken up several times in the last week feeling unable to breathe.  10/10 throat, "scratchy, tight" throat

## 2020-12-01 LAB — GC/CHLAMYDIA PROBE AMP (~~LOC~~) NOT AT ARMC
Chlamydia: NEGATIVE
Comment: NEGATIVE
Comment: NORMAL
Neisseria Gonorrhea: NEGATIVE

## 2020-12-12 DIAGNOSIS — A539 Syphilis, unspecified: Secondary | ICD-10-CM

## 2020-12-12 HISTORY — DX: Syphilis, unspecified: A53.9

## 2021-01-06 ENCOUNTER — Emergency Department (HOSPITAL_COMMUNITY): Payer: Managed Care, Other (non HMO)

## 2021-01-06 ENCOUNTER — Other Ambulatory Visit: Payer: Self-pay

## 2021-01-06 ENCOUNTER — Emergency Department (HOSPITAL_COMMUNITY)
Admission: EM | Admit: 2021-01-06 | Discharge: 2021-01-06 | Disposition: A | Discharge status: Home / Self Care | Payer: Managed Care, Other (non HMO) | Attending: Emergency Medicine | Admitting: Emergency Medicine

## 2021-01-06 ENCOUNTER — Encounter (HOSPITAL_COMMUNITY): Payer: Self-pay | Admitting: *Deleted

## 2021-01-06 DIAGNOSIS — F1910 Other psychoactive substance abuse, uncomplicated: Secondary | ICD-10-CM

## 2021-01-06 DIAGNOSIS — R000 Tachycardia, unspecified: Secondary | ICD-10-CM | POA: Diagnosis not present

## 2021-01-06 DIAGNOSIS — R569 Unspecified convulsions: Secondary | ICD-10-CM | POA: Diagnosis not present

## 2021-01-06 DIAGNOSIS — R079 Chest pain, unspecified: Secondary | ICD-10-CM | POA: Diagnosis not present

## 2021-01-06 DIAGNOSIS — Z91040 Latex allergy status: Secondary | ICD-10-CM | POA: Insufficient documentation

## 2021-01-06 DIAGNOSIS — R519 Headache, unspecified: Secondary | ICD-10-CM | POA: Diagnosis not present

## 2021-01-06 DIAGNOSIS — N3001 Acute cystitis with hematuria: Secondary | ICD-10-CM

## 2021-01-06 DIAGNOSIS — Z87891 Personal history of nicotine dependence: Secondary | ICD-10-CM | POA: Diagnosis not present

## 2021-01-06 DIAGNOSIS — R55 Syncope and collapse: Secondary | ICD-10-CM | POA: Diagnosis not present

## 2021-01-06 DIAGNOSIS — R112 Nausea with vomiting, unspecified: Secondary | ICD-10-CM | POA: Insufficient documentation

## 2021-01-06 DIAGNOSIS — R21 Rash and other nonspecific skin eruption: Secondary | ICD-10-CM | POA: Diagnosis not present

## 2021-01-06 DIAGNOSIS — R410 Disorientation, unspecified: Secondary | ICD-10-CM | POA: Insufficient documentation

## 2021-01-06 DIAGNOSIS — R1013 Epigastric pain: Secondary | ICD-10-CM | POA: Diagnosis not present

## 2021-01-06 LAB — COMPREHENSIVE METABOLIC PANEL
ALT: 14 U/L (ref 0–44)
AST: 19 U/L (ref 15–41)
Albumin: 3.3 g/dL — ABNORMAL LOW (ref 3.5–5.0)
Alkaline Phosphatase: 68 U/L (ref 38–126)
Anion gap: 10 (ref 5–15)
BUN: 7 mg/dL (ref 6–20)
CO2: 21 mmol/L — ABNORMAL LOW (ref 22–32)
Calcium: 8.7 mg/dL — ABNORMAL LOW (ref 8.9–10.3)
Chloride: 105 mmol/L (ref 98–111)
Creatinine, Ser: 0.85 mg/dL (ref 0.44–1.00)
GFR, Estimated: >60 mL/min (ref >60)
Glucose, Bld: 77 mg/dL (ref 70–99)
Potassium: 3.2 mmol/L — ABNORMAL LOW (ref 3.5–5.1)
Sodium: 136 mmol/L (ref 135–145)
Total Bilirubin: 0.9 mg/dL (ref 0.3–1.2)
Total Protein: 6.8 g/dL (ref 6.5–8.1)

## 2021-01-06 LAB — URINALYSIS, ROUTINE W REFLEX MICROSCOPIC
Bilirubin Urine: NEGATIVE
Glucose, UA: NEGATIVE mg/dL
Ketones, ur: 20 mg/dL — AB
Nitrite: NEGATIVE
Protein, ur: 100 mg/dL — AB
RBC / HPF: >50 RBC/hpf — ABNORMAL HIGH (ref 0–5)
Specific Gravity, Urine: 1.019 (ref 1.005–1.030)
WBC, UA: >50 WBC/hpf — ABNORMAL HIGH (ref 0–5)
pH: 5 (ref 5.0–8.0)

## 2021-01-06 LAB — CBC WITH DIFFERENTIAL/PLATELET
Abs Immature Granulocytes: 0.04 10*3/uL (ref 0.00–0.07)
Basophils Absolute: 0 10*3/uL (ref 0.0–0.1)
Basophils Relative: 0 %
Eosinophils Absolute: 0.1 10*3/uL (ref 0.0–0.5)
Eosinophils Relative: 1 %
HCT: 33.4 % — ABNORMAL LOW (ref 36.0–46.0)
Hemoglobin: 10.3 g/dL — ABNORMAL LOW (ref 12.0–15.0)
Immature Granulocytes: 1 %
Lymphocytes Relative: 10 %
Lymphs Abs: 0.8 10*3/uL (ref 0.7–4.0)
MCH: 25.4 pg — ABNORMAL LOW (ref 26.0–34.0)
MCHC: 30.8 g/dL (ref 30.0–36.0)
MCV: 82.5 fL (ref 80.0–100.0)
Monocytes Absolute: 1 10*3/uL (ref 0.1–1.0)
Monocytes Relative: 12 %
Neutro Abs: 6.1 10*3/uL (ref 1.7–7.7)
Neutrophils Relative %: 76 %
Platelets: 264 10*3/uL (ref 150–400)
RBC: 4.05 MIL/uL (ref 3.87–5.11)
RDW: 23 % — ABNORMAL HIGH (ref 11.5–15.5)
WBC: 7.9 10*3/uL (ref 4.0–10.5)
nRBC: 0 % (ref 0.0–0.2)

## 2021-01-06 LAB — ETHANOL: Alcohol, Ethyl (B): <10 mg/dL (ref <10)

## 2021-01-06 LAB — RAPID URINE DRUG SCREEN, HOSP PERFORMED
Amphetamines: POSITIVE — AB
Barbiturates: NOT DETECTED
Benzodiazepines: NOT DETECTED
Cocaine: NOT DETECTED
Opiates: NOT DETECTED
Tetrahydrocannabinol: NOT DETECTED

## 2021-01-06 LAB — LIPASE, BLOOD: Lipase: 39 U/L (ref 11–51)

## 2021-01-06 LAB — I-STAT BETA HCG BLOOD, ED (MC, WL, AP ONLY): I-stat hCG, quantitative: <5 m[IU]/mL (ref <5)

## 2021-01-06 MED ORDER — ONDANSETRON HCL 4 MG/2ML IJ SOLN
INTRAMUSCULAR | Status: AC
  Administered 2021-01-06: 4 mg via INTRAVENOUS
  Filled 2021-01-06: qty 2

## 2021-01-06 MED ORDER — ONDANSETRON HCL 4 MG/2ML IJ SOLN: 4.0000 mg | Freq: Once | INTRAMUSCULAR | Status: AC

## 2021-01-06 MED ORDER — CEPHALEXIN 500 MG PO CAPS: 500.0000 mg | ORAL_CAPSULE | Freq: Four times a day (QID) | ORAL | 0 refills | Status: AC

## 2021-01-06 MED ORDER — FLUCONAZOLE 150 MG PO TABS: 150.0000 mg | ORAL_TABLET | Freq: Once | ORAL | 0 refills | Status: AC

## 2021-01-06 MED ORDER — SODIUM CHLORIDE 0.9 % IV BOLUS
1000.0000 mL | Freq: Once | INTRAVENOUS | Status: AC
  Administered 2021-01-06: 1000 mL via INTRAVENOUS

## 2021-01-06 NOTE — Discharge Instructions (Addendum)
Recommend following up both with a primary care doctor as well as neurology.  If he had any episodes of passing out, any seizure-like activity, fever or other new concerning symptom, come back immediately to ER for reassessment.  Take antibiotic as prescribed for the suspected UTI.  If you develop a yeast infection, can take the prescribed fluconazole.  Recommend staying away from all illicit substances.

## 2021-01-06 NOTE — ED Triage Notes (Signed)
Brought to ED via [facility] for eval of possible seizure. Pt was found on her floor by a friend. Seemed postictal. Alert and oriented on arrival to the ED. In a c-collar. Pt has soreness to right side of her head. States she does not remember the event.

## 2021-01-06 NOTE — ED Provider Notes (Signed)
[facility] EMERGENCY DEPARTMENT Provider Note   CSN: [id number] Arrival date & time: 01/06/21  0539     History Chief Complaint  Patient presents with   Loss of Consciousness   Seizures    Vanessa Hester is a 25 y.o. female.   Loss of Consciousness Associated symptoms: chest pain, confusion, headaches, nausea, seizures and vomiting   Seizures Patient says that she woke up on the floor by her roommate today. Doesn't remember how she got there, but does remember seeing vomit in the toilet. Endorses waking up in two different places (the bathroom and the bedroom) so it is unclear whether patient had multiple episodes of syncope. Has a pain in the front of her forehead, does not remember if she hit her head. 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 Today she is feeling really poorly, endorsing a stomach pain that is epigastric in nature and radiates up into her chest. The pain in her chest is worse when she gets upset. Denies any changes in bowel or bladder habits. Denies ever passing out before but does have a remote history of febrile seizures as a child. 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 Has a full body rash due to the syphilis as well, specifically states it is not monkey pox. On further discussion with the patient she admits to using ectasy, but does not remember when she used it.     Past Medical History:  Diagnosis Date   Depression    Febrile seizure (HCC) 10/10/14   When she was an infany   Febrile seizure (HCC)    Ovarian cyst     Patient Active Problem List   Diagnosis Date Noted   Nexplanon in place 06/07/2018   Cellulitis of foot, right 03/01/2018   History of PID 11/05/2016   Chlamydia infection 11/02/2016   Ovarian cyst 07/22/2016   Anemia 02/05/2015   Depression 02/05/2015   Menorrhagia 02/04/2015    Fatigue 02/04/2015   Tension headache 02/04/2015   IBS (irritable bowel syndrome) 02/04/2015   Vitamin D deficiency 02/04/2015   Dysfunctional uterine bleeding 09/20/2013    Past Surgical History:  Procedure Laterality Date   KNEE SURGERY Left    TUMOR REMOVAL  10/10/14   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      OB History     Gravida  0   Para  0   Term  0   Preterm  0   AB  0   Living  0      SAB  0   IAB  0   Ectopic  0   Multiple  0   Live Births  0           Family History  Problem Relation Age of Onset   Migraines Father    Hypertension Father    Cancer Maternal Grandmother 37       colon and uterine   Cancer Maternal Grandfather        prostate   Arthritis Paternal Grandmother    Diabetes Paternal Grandmother    Asthma Paternal Grandfather    Depression Mother    Cancer Maternal Aunt 49       breast   Diabetes Maternal Uncle    Mental illness Paternal Aunt     Social History   Tobacco Use   Smoking status:  Former    Types: E-cigarettes    Quit date: 01/07/2017    Years since quitting: 4.0   Smokeless tobacco: Never   Tobacco comments:    1 pack would last 2-3 weeks  Vaping Use   Vaping Use: Every day  Substance Use Topics   Alcohol use: No   Drug use: No    Home Medications Prior to Admission medications   Medication Sig Start Date End Date Taking? Authorizing Provider  acetaminophen (TYLENOL) 325 MG tablet Take 650 mg by mouth every 6 (six) hours as needed for mild pain.    [provider]  Cholecalciferol (VITAMIN D3) 1.25 MG (50000 UT) TABS Take 1 tablet by mouth every 7 (seven) days. Patient not taking: Reported on 04/08/2020 07/04/19   Emi Belfast, FNP  clindamycin (CLEOCIN) 150 MG capsule Take 2 capsules (300 mg total) by mouth 4 (four) times daily. 11/28/20   Gilda Crease, MD  ibuprofen (ADVIL) 200 MG tablet Take 400 mg by mouth every 6 (six) hours as needed for headache or cramping.    [provider]  ibuprofen (ADVIL,MOTRIN) 800 MG tablet Take 1 tablet (800 mg total) by mouth every 8 (eight) hours as needed. Patient not taking: Reported on 04/08/2020 10/04/17   Faythe Ghee, PA-C  ondansetron (ZOFRAN-ODT) 4 MG disintegrating tablet Take 1 tablet (4 mg total) by mouth every 8 (eight) hours as needed for nausea or vomiting. Patient not taking: Reported on 04/08/2020 08/12/19   Kem Boroughs B, FNP  sertraline (ZOLOFT) 100 MG tablet Take 1 tablet (100 mg total) by mouth daily. Patient not taking: Reported on 04/08/2020 07/04/19   Emi Belfast, FNP    Allergies    Other, Benadryl [diphenhydramine], Gardasil 9 [hpv 9-valent recomb vaccine], Amoxicillin, and Latex  Review of Systems   Review of Systems  HENT:  Positive for sore throat.   Eyes:  Positive for visual disturbance.  Respiratory:  Positive for chest tightness.   Cardiovascular:  Positive for chest pain and syncope.  Gastrointestinal:  Positive for abdominal pain, nausea and vomiting.  Skin:  Positive for rash.  Neurological:  Positive for seizures, syncope and headaches.  Psychiatric/Behavioral:  Positive for confusion. The patient is nervous/anxious.    Physical Exam Updated Vital Signs BP 124/78   Pulse 100   Temp 98.8 F (37.1 C) (Oral)   Resp 12   SpO2 98%   Physical Exam Constitutional:      General: She is awake.  HENT:     Head: Normocephalic. No abrasion or contusion.  Eyes:     General: Lids are normal.     Extraocular Movements: Extraocular movements intact.     Conjunctiva/sclera:     Left eye: Left conjunctiva is injected.     Comments: Pupil on the left is asymmetric but reactive  Neck:     Comments: Cervical collar in place Cardiovascular:     Rate and Rhythm: Regular rhythm. Tachycardia present.  Pulmonary:     Effort: Pulmonary effort is normal.     Breath sounds: Normal breath sounds.  Abdominal:     General: Bowel sounds are normal. There is distension.      Palpations: Abdomen is soft.     Tenderness: There is abdominal tenderness.  Skin:    Findings: Rash present. Rash is crusting, macular and papular.  Psychiatric:        Mood and Affect: Mood is anxious. Affect is tearful.  Cognition and Memory: She exhibits impaired recent memory.    ED Results / Procedures / Treatments   Labs (all labs ordered are listed, but only abnormal results are displayed) Labs Reviewed  CBC WITH DIFFERENTIAL/PLATELET  COMPREHENSIVE METABOLIC PANEL  RAPID URINE DRUG SCREEN, HOSP PERFORMED  URINALYSIS, ROUTINE W REFLEX MICROSCOPIC  ETHANOL  LIPASE, BLOOD  I-STAT BETA HCG BLOOD, ED ([facility], [facility], [facility] ONLY)    EKG EKG Interpretation  Date/Time:  Tuesday January 06 2021 09:25:38 EDT Ventricular Rate:  95 PR Interval:  149 QRS Duration: 102 QT Interval:  366 QTC Calculation: 461 R Axis:   64 Text Interpretation: Sinus tachycardia Ventricular premature complex Confirmed by Marianna Fuss ([id number]) on 01/06/2021 9:54:17 AM  Radiology No results found.  Procedures Procedures   Medications Ordered in ED Medications - No data to display  ED Course  I have reviewed the triage vital signs and the nursing notes.  Pertinent labs & imaging results that were available during my care of the patient were reviewed by me and considered in my medical decision making (see chart for details).    MDM Rules/Calculators/A&P                           Patient given fluids and zofran for nausea, felt much better once received. CT head and CXR negative for acute process. Labs unremarkable, Hgb stable from prior. Patient's mental status improved and able to converse better once calmed down. Says that she was feeling weak and nauseous this morning, favor patient had an episode of vasovagal syncope given complaints of nausea vomiting though  cannot rule out seizure. Will provide patient with neurology referral to discuss need for further monitoring as an outpatient. UDS  positive for amphetamines, urinalysis suggestive of a UTI. Will prescribe 1 week course of kefflex as well as a one dose course of diflucan per patient request as she has frequent yeast infections. Recommend follow up with primary doctor in about a week and discussed substance use cessation. Patient sable for discharge.   Final Clinical Impression(s) / ED Diagnoses Final diagnoses:  None    Rx / DC Orders ED Discharge Orders     None        Ilene Qua, MD 01/06/21 1354    Milagros Loll, MD 01/07/21 [phone]

## 2021-03-16 ENCOUNTER — Other Ambulatory Visit: Payer: Self-pay

## 2021-03-16 DIAGNOSIS — Z5321 Procedure and treatment not carried out due to patient leaving prior to being seen by health care provider: Secondary | ICD-10-CM | POA: Insufficient documentation

## 2021-03-16 DIAGNOSIS — R109 Unspecified abdominal pain: Secondary | ICD-10-CM | POA: Diagnosis present

## 2021-03-16 LAB — URINALYSIS, ROUTINE W REFLEX MICROSCOPIC
Bacteria, UA: NONE SEEN
Bilirubin Urine: NEGATIVE
Glucose, UA: NEGATIVE mg/dL
Ketones, ur: NEGATIVE mg/dL
Nitrite: NEGATIVE
Protein, ur: 100 mg/dL — AB
Specific Gravity, Urine: 1.013 (ref 1.005–1.030)
WBC, UA: >50 WBC/hpf — ABNORMAL HIGH (ref 0–5)
pH: 5 (ref 5.0–8.0)

## 2021-03-16 MED ORDER — ONDANSETRON 4 MG PO TBDP
4.0000 mg | ORAL_TABLET | Freq: Once | ORAL | Status: AC
  Administered 2021-03-16: 4 mg via ORAL
  Filled 2021-03-16: qty 1

## 2021-03-16 NOTE — ED Triage Notes (Signed)
Pt complains of severe left lower side pain started 1700. Feels better when sitting. Standing or stretching abd makes it unbearable.  Vomited w EMS.  Urine smells stronger than normal.

## 2021-03-17 ENCOUNTER — Emergency Department: Payer: Managed Care, Other (non HMO)

## 2021-03-17 ENCOUNTER — Emergency Department
Admission: EM | Admit: 2021-03-17 | Discharge: 2021-03-17 | Disposition: A | Discharge status: Home / Self Care | Payer: Managed Care, Other (non HMO) | Attending: Emergency Medicine | Admitting: Emergency Medicine

## 2021-03-17 LAB — CBC
HCT: 37.5 % (ref 36.0–46.0)
Hemoglobin: 12.2 g/dL (ref 12.0–15.0)
MCH: 28.1 pg (ref 26.0–34.0)
MCHC: 32.5 g/dL (ref 30.0–36.0)
MCV: 86.4 fL (ref 80.0–100.0)
Platelets: 325 10*3/uL (ref 150–400)
RBC: 4.34 MIL/uL (ref 3.87–5.11)
RDW: 16.8 % — ABNORMAL HIGH (ref 11.5–15.5)
WBC: 22.8 10*3/uL — ABNORMAL HIGH (ref 4.0–10.5)
nRBC: 0 % (ref 0.0–0.2)

## 2021-03-17 LAB — BASIC METABOLIC PANEL
Anion gap: 8 (ref 5–15)
BUN: 11 mg/dL (ref 6–20)
CO2: 24 mmol/L (ref 22–32)
Calcium: 8.5 mg/dL — ABNORMAL LOW (ref 8.9–10.3)
Chloride: 101 mmol/L (ref 98–111)
Creatinine, Ser: 0.99 mg/dL (ref 0.44–1.00)
GFR, Estimated: >60 mL/min (ref >60)
Glucose, Bld: 120 mg/dL — ABNORMAL HIGH (ref 70–99)
Potassium: 3.6 mmol/L (ref 3.5–5.1)
Sodium: 133 mmol/L — ABNORMAL LOW (ref 135–145)

## 2021-03-17 LAB — CBC WITH DIFFERENTIAL/PLATELET
Abs Immature Granulocytes: 0.4 10*3/uL — ABNORMAL HIGH (ref 0.00–0.07)
Basophils Absolute: 0.1 10*3/uL (ref 0.0–0.1)
Basophils Relative: 0 %
Eosinophils Absolute: 0 10*3/uL (ref 0.0–0.5)
Eosinophils Relative: 0 %
HCT: 37.6 % (ref 36.0–46.0)
Hemoglobin: 12 g/dL (ref 12.0–15.0)
Immature Granulocytes: 2 %
Lymphocytes Relative: 3 %
Lymphs Abs: 0.7 10*3/uL (ref 0.7–4.0)
MCH: 27.7 pg (ref 26.0–34.0)
MCHC: 31.9 g/dL (ref 30.0–36.0)
MCV: 86.8 fL (ref 80.0–100.0)
Monocytes Absolute: 1.5 10*3/uL — ABNORMAL HIGH (ref 0.1–1.0)
Monocytes Relative: 6 %
Neutro Abs: 20.3 10*3/uL — ABNORMAL HIGH (ref 1.7–7.7)
Neutrophils Relative %: 89 %
Platelets: 331 10*3/uL (ref 150–400)
RBC: 4.33 MIL/uL (ref 3.87–5.11)
RDW: 16.8 % — ABNORMAL HIGH (ref 11.5–15.5)
WBC: 22.9 10*3/uL — ABNORMAL HIGH (ref 4.0–10.5)
nRBC: 0 % (ref 0.0–0.2)

## 2021-03-17 LAB — HEPATIC FUNCTION PANEL
ALT: 9 U/L (ref 0–44)
AST: 16 U/L (ref 15–41)
Albumin: 3.3 g/dL — ABNORMAL LOW (ref 3.5–5.0)
Alkaline Phosphatase: 54 U/L (ref 38–126)
Bilirubin, Direct: <0.1 mg/dL (ref 0.0–0.2)
Total Bilirubin: 0.6 mg/dL (ref 0.3–1.2)
Total Protein: 6.7 g/dL (ref 6.5–8.1)

## 2021-03-17 LAB — PREGNANCY, URINE: Preg Test, Ur: NEGATIVE

## 2021-03-17 LAB — LIPASE, BLOOD: Lipase: 32 U/L (ref 11–51)

## 2021-03-17 NOTE — ED Notes (Signed)
No answer when called several times from lobby 

## 2021-03-18 ENCOUNTER — Telehealth: Payer: Self-pay | Admitting: Emergency Medicine

## 2021-03-18 NOTE — Telephone Encounter (Signed)
Called patient due to left emergency department before provider exam to inquire about condition and follow up plans. I called patient's number and it will not accept calls at this time.

## 2021-05-27 ENCOUNTER — Encounter: Payer: Self-pay | Admitting: Emergency Medicine

## 2021-05-27 DIAGNOSIS — E876 Hypokalemia: Secondary | ICD-10-CM | POA: Diagnosis present

## 2021-05-27 DIAGNOSIS — Z888 Allergy status to other drugs, medicaments and biological substances status: Secondary | ICD-10-CM

## 2021-05-27 DIAGNOSIS — N200 Calculus of kidney: Secondary | ICD-10-CM | POA: Diagnosis not present

## 2021-05-27 DIAGNOSIS — N136 Pyonephrosis: Secondary | ICD-10-CM | POA: Diagnosis not present

## 2021-05-27 DIAGNOSIS — B9620 Unspecified Escherichia coli [E. coli] as the cause of diseases classified elsewhere: Secondary | ICD-10-CM | POA: Diagnosis present

## 2021-05-27 DIAGNOSIS — Z91040 Latex allergy status: Secondary | ICD-10-CM

## 2021-05-27 DIAGNOSIS — Z20822 Contact with and (suspected) exposure to covid-19: Secondary | ICD-10-CM | POA: Diagnosis present

## 2021-05-27 DIAGNOSIS — Z6838 Body mass index (BMI) 38.0-38.9, adult: Secondary | ICD-10-CM

## 2021-05-27 DIAGNOSIS — Z87891 Personal history of nicotine dependence: Secondary | ICD-10-CM

## 2021-05-27 DIAGNOSIS — Z79899 Other long term (current) drug therapy: Secondary | ICD-10-CM

## 2021-05-27 DIAGNOSIS — F32A Depression, unspecified: Secondary | ICD-10-CM | POA: Diagnosis present

## 2021-05-27 DIAGNOSIS — E669 Obesity, unspecified: Secondary | ICD-10-CM | POA: Diagnosis present

## 2021-05-27 LAB — CBC
HCT: 41.2 % (ref 36.0–46.0)
Hemoglobin: 13.3 g/dL (ref 12.0–15.0)
MCH: 27.9 pg (ref 26.0–34.0)
MCHC: 32.3 g/dL (ref 30.0–36.0)
MCV: 86.6 fL (ref 80.0–100.0)
Platelets: 356 10*3/uL (ref 150–400)
RBC: 4.76 MIL/uL (ref 3.87–5.11)
RDW: 15.3 % (ref 11.5–15.5)
WBC: 20.3 10*3/uL — ABNORMAL HIGH (ref 4.0–10.5)
nRBC: 0 % (ref 0.0–0.2)

## 2021-05-27 LAB — COMPREHENSIVE METABOLIC PANEL
ALT: 15 U/L (ref 0–44)
AST: 19 U/L (ref 15–41)
Albumin: 3.8 g/dL (ref 3.5–5.0)
Alkaline Phosphatase: 88 U/L (ref 38–126)
Anion gap: 7 (ref 5–15)
BUN: 16 mg/dL (ref 6–20)
CO2: 27 mmol/L (ref 22–32)
Calcium: 8.7 mg/dL — ABNORMAL LOW (ref 8.9–10.3)
Chloride: 102 mmol/L (ref 98–111)
Creatinine, Ser: 1.03 mg/dL — ABNORMAL HIGH (ref 0.44–1.00)
GFR, Estimated: >60 mL/min (ref >60)
Glucose, Bld: 117 mg/dL — ABNORMAL HIGH (ref 70–99)
Potassium: 3.4 mmol/L — ABNORMAL LOW (ref 3.5–5.1)
Sodium: 136 mmol/L (ref 135–145)
Total Bilirubin: 0.6 mg/dL (ref 0.3–1.2)
Total Protein: 7.3 g/dL (ref 6.5–8.1)

## 2021-05-27 LAB — LIPASE, BLOOD: Lipase: 54 U/L — ABNORMAL HIGH (ref 11–51)

## 2021-05-27 LAB — URINALYSIS, ROUTINE W REFLEX MICROSCOPIC
Bilirubin Urine: NEGATIVE
Glucose, UA: NEGATIVE mg/dL
Ketones, ur: NEGATIVE mg/dL
Nitrite: POSITIVE — AB
Protein, ur: 100 mg/dL — AB
Specific Gravity, Urine: 1.016 (ref 1.005–1.030)
WBC, UA: >50 WBC/hpf — ABNORMAL HIGH (ref 0–5)
pH: 5 (ref 5.0–8.0)

## 2021-05-27 LAB — HCG, QUANTITATIVE, PREGNANCY: hCG, Beta Chain, Quant, S: <1 m[IU]/mL (ref <5)

## 2021-05-27 MED ORDER — ONDANSETRON 4 MG PO TBDP
4.0000 mg | ORAL_TABLET | Freq: Once | ORAL | Status: AC | PRN
  Administered 2021-05-27: 4 mg via ORAL
  Filled 2021-05-27: qty 1

## 2021-05-27 NOTE — ED Triage Notes (Signed)
Pt reports sudden onset of left lower abdominal pain that started at 1900 with nausea. Pt found in bathroom vomiting prior to triage with report of first emesis. Pt reports abnormal heavy period since starting 1/9. Per pt, she is not pregnant and no previous gynecological hx.

## 2021-05-28 ENCOUNTER — Other Ambulatory Visit: Payer: Self-pay

## 2021-05-28 ENCOUNTER — Inpatient Hospital Stay
Admission: EM | Admit: 2021-05-28 | Discharge: 2021-05-30 | DRG: 690 | Disposition: A | Discharge status: Home / Self Care | Payer: BC Managed Care – PPO | Attending: Family Medicine | Admitting: Family Medicine

## 2021-05-28 ENCOUNTER — Emergency Department: Payer: BC Managed Care – PPO

## 2021-05-28 ENCOUNTER — Encounter: Payer: Self-pay | Admitting: Family Medicine

## 2021-05-28 DIAGNOSIS — F32A Depression, unspecified: Secondary | ICD-10-CM | POA: Diagnosis present

## 2021-05-28 DIAGNOSIS — Z6838 Body mass index (BMI) 38.0-38.9, adult: Secondary | ICD-10-CM | POA: Diagnosis not present

## 2021-05-28 DIAGNOSIS — N200 Calculus of kidney: Secondary | ICD-10-CM | POA: Diagnosis present

## 2021-05-28 DIAGNOSIS — N390 Urinary tract infection, site not specified: Secondary | ICD-10-CM | POA: Diagnosis not present

## 2021-05-28 DIAGNOSIS — E669 Obesity, unspecified: Secondary | ICD-10-CM | POA: Diagnosis present

## 2021-05-28 DIAGNOSIS — N136 Pyonephrosis: Secondary | ICD-10-CM | POA: Diagnosis present

## 2021-05-28 DIAGNOSIS — E876 Hypokalemia: Secondary | ICD-10-CM | POA: Diagnosis present

## 2021-05-28 DIAGNOSIS — A4150 Gram-negative sepsis, unspecified: Secondary | ICD-10-CM | POA: Diagnosis not present

## 2021-05-28 DIAGNOSIS — N12 Tubulo-interstitial nephritis, not specified as acute or chronic: Secondary | ICD-10-CM | POA: Diagnosis not present

## 2021-05-28 DIAGNOSIS — Z20822 Contact with and (suspected) exposure to covid-19: Secondary | ICD-10-CM | POA: Diagnosis present

## 2021-05-28 DIAGNOSIS — Z87891 Personal history of nicotine dependence: Secondary | ICD-10-CM | POA: Diagnosis not present

## 2021-05-28 DIAGNOSIS — N1330 Unspecified hydronephrosis: Secondary | ICD-10-CM

## 2021-05-28 DIAGNOSIS — N131 Hydronephrosis with ureteral stricture, not elsewhere classified: Secondary | ICD-10-CM | POA: Diagnosis not present

## 2021-05-28 DIAGNOSIS — R6510 Systemic inflammatory response syndrome (SIRS) of non-infectious origin without acute organ dysfunction: Secondary | ICD-10-CM

## 2021-05-28 DIAGNOSIS — Z888 Allergy status to other drugs, medicaments and biological substances status: Secondary | ICD-10-CM | POA: Diagnosis not present

## 2021-05-28 DIAGNOSIS — Z79899 Other long term (current) drug therapy: Secondary | ICD-10-CM | POA: Diagnosis not present

## 2021-05-28 DIAGNOSIS — Z91040 Latex allergy status: Secondary | ICD-10-CM | POA: Diagnosis not present

## 2021-05-28 DIAGNOSIS — N10 Acute tubulo-interstitial nephritis: Secondary | ICD-10-CM | POA: Diagnosis not present

## 2021-05-28 DIAGNOSIS — B9620 Unspecified Escherichia coli [E. coli] as the cause of diseases classified elsewhere: Secondary | ICD-10-CM | POA: Diagnosis present

## 2021-05-28 HISTORY — DX: Anxiety disorder, unspecified: F41.9

## 2021-05-28 LAB — CBC
HCT: 34.8 % — ABNORMAL LOW (ref 36.0–46.0)
Hemoglobin: 11.5 g/dL — ABNORMAL LOW (ref 12.0–15.0)
MCH: 27.9 pg (ref 26.0–34.0)
MCHC: 33 g/dL (ref 30.0–36.0)
MCV: 84.5 fL (ref 80.0–100.0)
Platelets: 257 10*3/uL (ref 150–400)
RBC: 4.12 MIL/uL (ref 3.87–5.11)
RDW: 15.3 % (ref 11.5–15.5)
WBC: 16.5 10*3/uL — ABNORMAL HIGH (ref 4.0–10.5)
nRBC: 0 % (ref 0.0–0.2)

## 2021-05-28 LAB — CBC WITH DIFFERENTIAL/PLATELET
Abs Immature Granulocytes: 0.12 10*3/uL — ABNORMAL HIGH (ref 0.00–0.07)
Basophils Absolute: 0.1 10*3/uL (ref 0.0–0.1)
Basophils Relative: 0 %
Eosinophils Absolute: 0.1 10*3/uL (ref 0.0–0.5)
Eosinophils Relative: 1 %
HCT: 41.8 % (ref 36.0–46.0)
Hemoglobin: 13.2 g/dL (ref 12.0–15.0)
Immature Granulocytes: 1 %
Lymphocytes Relative: 8 %
Lymphs Abs: 1.7 10*3/uL (ref 0.7–4.0)
MCH: 27.4 pg (ref 26.0–34.0)
MCHC: 31.6 g/dL (ref 30.0–36.0)
MCV: 86.7 fL (ref 80.0–100.0)
Monocytes Absolute: 1.2 10*3/uL — ABNORMAL HIGH (ref 0.1–1.0)
Monocytes Relative: 6 %
Neutro Abs: 17 10*3/uL — ABNORMAL HIGH (ref 1.7–7.7)
Neutrophils Relative %: 84 %
Platelets: 367 10*3/uL (ref 150–400)
RBC: 4.82 MIL/uL (ref 3.87–5.11)
RDW: 15.3 % (ref 11.5–15.5)
WBC: 20 10*3/uL — ABNORMAL HIGH (ref 4.0–10.5)
nRBC: 0 % (ref 0.0–0.2)

## 2021-05-28 LAB — BASIC METABOLIC PANEL
Anion gap: 8 (ref 5–15)
BUN: 14 mg/dL (ref 6–20)
CO2: 26 mmol/L (ref 22–32)
Calcium: 8.1 mg/dL — ABNORMAL LOW (ref 8.9–10.3)
Chloride: 106 mmol/L (ref 98–111)
Creatinine, Ser: 0.86 mg/dL (ref 0.44–1.00)
GFR, Estimated: >60 mL/min (ref >60)
Glucose, Bld: 117 mg/dL — ABNORMAL HIGH (ref 70–99)
Potassium: 3.8 mmol/L (ref 3.5–5.1)
Sodium: 140 mmol/L (ref 135–145)

## 2021-05-28 LAB — RESP PANEL BY RT-PCR (FLU A&B, COVID) ARPGX2
Influenza A by PCR: NEGATIVE
Influenza B by PCR: NEGATIVE
SARS Coronavirus 2 by RT PCR: NEGATIVE

## 2021-05-28 LAB — LACTIC ACID, PLASMA
Lactic Acid, Venous: 1.5 mmol/L (ref 0.5–1.9)
Lactic Acid, Venous: 1.5 mmol/L (ref 0.5–1.9)

## 2021-05-28 LAB — PROTIME-INR
INR: 1.2 (ref 0.8–1.2)
Prothrombin Time: 15.6 seconds — ABNORMAL HIGH (ref 11.4–15.2)

## 2021-05-28 LAB — PROCALCITONIN: Procalcitonin: 2.37 ng/mL

## 2021-05-28 LAB — CORTISOL-AM, BLOOD: Cortisol - AM: 7.7 ug/dL (ref 6.7–22.6)

## 2021-05-28 MED ORDER — KETOROLAC TROMETHAMINE 30 MG/ML IJ SOLN
15.0000 mg | Freq: Once | INTRAMUSCULAR | Status: AC
  Administered 2021-05-28: 15 mg via INTRAVENOUS
  Filled 2021-05-28: qty 1

## 2021-05-28 MED ORDER — ONDANSETRON HCL 4 MG/2ML IJ SOLN
4.0000 mg | Freq: Four times a day (QID) | INTRAMUSCULAR | Status: DC | PRN
  Administered 2021-05-29: 15:00:00 4 mg via INTRAVENOUS
  Filled 2021-05-28: qty 2

## 2021-05-28 MED ORDER — SODIUM CHLORIDE 0.9 % IV BOLUS
1000.0000 mL | Freq: Once | INTRAVENOUS | Status: AC
  Administered 2021-05-28: 1000 mL via INTRAVENOUS

## 2021-05-28 MED ORDER — SODIUM CHLORIDE 0.9 % IV SOLN: 2.0000 g | Freq: Once | INTRAVENOUS | Status: DC

## 2021-05-28 MED ORDER — ENOXAPARIN SODIUM 40 MG/0.4ML IJ SOSY: 40.0000 mg | PREFILLED_SYRINGE | INTRAMUSCULAR | Status: DC

## 2021-05-28 MED ORDER — ACETAMINOPHEN 325 MG PO TABS
650.0000 mg | ORAL_TABLET | Freq: Four times a day (QID) | ORAL | Status: DC | PRN
  Administered 2021-05-28 – 2021-05-29 (×4): 650 mg via ORAL
  Filled 2021-05-28 (×4): qty 2

## 2021-05-28 MED ORDER — TRAZODONE HCL 50 MG PO TABS
25.0000 mg | ORAL_TABLET | Freq: Every evening | ORAL | Status: DC | PRN
  Administered 2021-05-29: 21:00:00 25 mg via ORAL
  Filled 2021-05-28: qty 1

## 2021-05-28 MED ORDER — SODIUM CHLORIDE 0.9 % IV SOLN
2.0000 g | Freq: Every day | INTRAVENOUS | Status: DC
  Administered 2021-05-28 – 2021-05-30 (×3): 2 g via INTRAVENOUS
  Filled 2021-05-28 (×3): qty 20

## 2021-05-28 MED ORDER — SODIUM CHLORIDE 0.9 % IV SOLN: INTRAVENOUS | Status: DC

## 2021-05-28 MED ORDER — MAGNESIUM HYDROXIDE 400 MG/5ML PO SUSP
30.0000 mL | Freq: Every day | ORAL | Status: DC | PRN
  Filled 2021-05-28: qty 30

## 2021-05-28 MED ORDER — MORPHINE SULFATE (PF) 2 MG/ML IV SOLN: 2.0000 mg | INTRAVENOUS | Status: DC | PRN

## 2021-05-28 MED ORDER — ACETAMINOPHEN 650 MG RE SUPP: 650.0000 mg | Freq: Four times a day (QID) | RECTAL | Status: DC | PRN

## 2021-05-28 MED ORDER — KETOROLAC TROMETHAMINE 30 MG/ML IJ SOLN
15.0000 mg | Freq: Four times a day (QID) | INTRAMUSCULAR | Status: DC | PRN
  Administered 2021-05-28: 15 mg via INTRAVENOUS
  Filled 2021-05-28 (×6): qty 1

## 2021-05-28 MED ORDER — ONDANSETRON HCL 4 MG/2ML IJ SOLN
4.0000 mg | Freq: Once | INTRAMUSCULAR | Status: AC
  Administered 2021-05-28: 4 mg via INTRAVENOUS
  Filled 2021-05-28: qty 2

## 2021-05-28 MED ORDER — ONDANSETRON HCL 4 MG PO TABS
4.0000 mg | ORAL_TABLET | Freq: Four times a day (QID) | ORAL | Status: DC | PRN
  Administered 2021-05-28: 4 mg via ORAL
  Filled 2021-05-28: qty 1

## 2021-05-28 NOTE — Progress Notes (Signed)
°  Chaplain On-Call responded to Rapid Response notification.  Arrived on the Unit and learned from RN that their concern was an elevation in the "MEWS" number for the patient.  Patient is being attended by medical team. No family members are present.  Chaplain Pollyann Samples M.Div., [facility]

## 2021-05-28 NOTE — ED Provider Notes (Signed)
[facility] Provider Note    Event Date/Time   First MD Initiated Contact with Patient 05/28/21 [phone]     (approximate)   History   Abdominal Pain   HPI  Vanessa Hester is a 26 y.o. female who presents to the ED from home with a chief complaint of left lower abdominal pain, chills, nausea and vomiting.  Reports heavy period beginning 3 days ago.  Denies fever, cough, chest pain, shortness of breath.  No prior history of kidney stones.     Past Medical History   Past Medical History:  Diagnosis Date   Depression    Febrile seizure ([place]) 10/10/14   When she was an infany   Febrile seizure ([place])    Ovarian cyst      Active Problem List   Patient Active Problem List   Diagnosis Date Noted   Sepsis due to gram-negative UTI (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      Home Medications   Prior to Admission medications   Medication Sig Start Date End Date Taking? Authorizing Provider  acetaminophen (TYLENOL) 325 MG tablet Take 650 mg by mouth every 6 (six) hours as needed for mild pain.    [provider]  Cholecalciferol (VITAMIN D3) 1.25 MG (50000 UT) TABS Take 1 tablet by mouth every 7 (seven) days. Patient not taking: Reported on 04/08/2020 07/04/19   Elby Beck, FNP  clindamycin (CLEOCIN) 150 MG capsule Take 2 capsules (300 mg total) by mouth 4 (four) times daily. 11/28/20   Orpah Greek, MD  ibuprofen (ADVIL) 200 MG tablet Take 400 mg by mouth every 6 (six) hours as needed for headache or cramping.    [provider]  ibuprofen (ADVIL,MOTRIN) 800 MG tablet Take 1 tablet (800 mg total) by mouth every 8 (eight) hours as needed. Patient not taking: Reported on 04/08/2020 10/04/17   Versie Starks, PA-C  ondansetron (ZOFRAN-ODT) 4 MG disintegrating tablet Take 1 tablet (4 mg total) by mouth every 8 (eight) hours as needed for nausea or vomiting. Patient not taking: Reported on 04/08/2020 08/12/19   Sherrie George B, FNP  sertraline (ZOLOFT) 100 MG tablet Take 1 tablet (100 mg total) by mouth daily. Patient not taking: Reported on 04/08/2020 07/04/19   Elby Beck, FNP     Allergies  Other, Benadryl [diphenhydramine], Gardasil 9 [hpv 9-valent recomb vaccine], Amoxicillin, and Latex   Family History   Family History  Problem Relation Age of Onset   Migraines Father    Hypertension Father    Cancer Maternal Grandmother 25       colon and uterine   Cancer Maternal Grandfather        prostate   Arthritis Paternal Grandmother    Diabetes Paternal Grandmother    Asthma Paternal Grandfather    Depression Mother    Cancer Maternal Aunt 67  breast   Diabetes Maternal Uncle    Mental illness Paternal Aunt      Physical Exam  Triage Vital Signs: ED Triage Vitals [05/27/21 2217]  Enc Vitals Group     BP 135/76     Pulse Rate (!) 128     Resp (!) 22     Temp 98 F (36.7 C)     Temp Source Oral     SpO2 98 %     Weight      Height      Head Circumference      Peak Flow      Pain Score      Pain Loc      Pain Edu?      Excl. in [place]?     Updated Vital Signs: BP 130/69    Pulse (!) 103    Temp 98 F (36.7 C) (Oral)    Resp 16    LMP 05/25/2021 Comment: neg hcg   SpO2 100%    General: Awake, mild distress.  CV:  Tachycardic.  Good peripheral perfusion.  Resp:  Normal effort.  Abd:  Mild suprapubic tenderness to palpation  without rebound or guarding.  No distention.  Other:  No vesicles on trunk.   ED Results / Procedures / Treatments  Labs (all labs ordered are listed, but only abnormal results are displayed) Labs Reviewed  LIPASE, BLOOD - Abnormal; Notable for the following components:      Result Value   Lipase 54 (*)    All other components within normal limits  COMPREHENSIVE METABOLIC PANEL - Abnormal; Notable for the following components:   Potassium 3.4 (*)    Glucose, Bld 117 (*)    Creatinine, Ser 1.03 (*)    Calcium 8.7 (*)    All other components within normal limits  CBC - Abnormal; Notable for the following components:   WBC 20.3 (*)    All other components within normal limits  URINALYSIS, ROUTINE W REFLEX MICROSCOPIC - Abnormal; Notable for the following components:   Color, Urine YELLOW (*)    APPearance CLOUDY (*)    Hgb urine dipstick LARGE (*)    Protein, ur 100 (*)    Nitrite POSITIVE (*)    Leukocytes,Ua LARGE (*)    WBC, UA >50 (*)    Bacteria, UA FEW (*)    All other components within normal limits  CBC WITH DIFFERENTIAL/PLATELET - Abnormal; Notable for the following components:   WBC 20.0 (*)    Neutro Abs 17.0 (*)    Monocytes Absolute 1.2 (*)    Abs Immature Granulocytes 0.12 (*)    All other components within normal limits  CULTURE, BLOOD (ROUTINE X 2)  CULTURE, BLOOD (ROUTINE X 2)  URINE CULTURE  HCG, QUANTITATIVE, PREGNANCY  LACTIC ACID, PLASMA  LACTIC ACID, PLASMA     EKG  None   RADIOLOGY I have personally reviewed patient's CT scan as well as the radiology interpretation:  CT renal colic study: Moderate left hydronephrosis and perinephric stranding, left ureter decompressed without stones; stones in renal pelvis; unknown etiology of hydronephrosis  Official radiology report(s): CT Renal Stone Study  Result Date: 05/28/2021 CLINICAL DATA:  Left lower abdominal pain, flank pain EXAM: CT ABDOMEN AND PELVIS WITHOUT CONTRAST TECHNIQUE: Multidetector  CT imaging of the abdomen and pelvis was performed following the standard protocol without IV contrast. RADIATION DOSE REDUCTION: This exam was performed according to the departmental dose-optimization program which includes automated exposure control, adjustment of  the mA and/or kV according to patient size and/or use of iterative reconstruction technique. COMPARISON:  06/27/2016 FINDINGS: Lower chest: No acute abnormality Hepatobiliary: No focal hepatic abnormality. Gallbladder unremarkable. Pancreas: No focal abnormality or ductal dilatation. Spleen: No focal abnormality.  Normal size. Adrenals/Urinary Tract: There is left hydronephrosis and perinephric stranding. The left ureter is decompressed. There are layering stones in the dilated left renal pelvis measuring up to 12 mm. 6 mm left lower pole stone. No ureteral stones are visualized. No stones or hydronephrosis on the right. Adrenal glands and urinary bladder are unremarkable. Stomach/Bowel: Normal appendix. Stomach, large and small bowel grossly unremarkable. Vascular/Lymphatic: No evidence of aneurysm or adenopathy. Reproductive: Uterus and adnexa unremarkable.  No mass. Other: No free fluid or free air. Musculoskeletal: No acute bony abnormality. IMPRESSION: Moderate left hydronephrosis and perinephric stranding. The left ureter is decompressed without stones. There are layering stones in the dilated renal pelvis measuring up to 12 mm. Exact cause of the hydronephrosis/obstruction not clear at this time. Electronically Signed   By: Rolm Baptise M.D.   On: 05/28/2021 01:15     PROCEDURES:  Critical Care performed: No  Procedures   MEDICATIONS ORDERED IN ED: Medications  sodium chloride 0.9 % bolus 1,000 mL (has no administration in time range)  ondansetron (ZOFRAN) injection 4 mg (has no administration in time range)  ketorolac (TORADOL) 30 MG/ML injection 15 mg (has no administration in time range)  cefTRIAXone (ROCEPHIN) 2 g in sodium  chloride 0.9 % 100 mL IVPB (has no administration in time range)  ondansetron (ZOFRAN-ODT) disintegrating tablet 4 mg (4 mg Oral Given 05/27/21 2222)     IMPRESSION / MDM / ASSESSMENT AND PLAN / ED COURSE  I reviewed the triage vital signs and the nursing notes.                             26 year old female presenting with chills, left lower quadrant abdominal pain, nausea and vomiting. Differential diagnosis includes, but is not limited to, ovarian cyst, ovarian torsion, acute appendicitis, diverticulitis, urinary tract infection/pyelonephritis, endometriosis, bowel obstruction, colitis, renal colic, gastroenteritis, hernia, fibroids, endometriosis, pregnancy related pain including ectopic pregnancy, etc. I have personally reviewed patient's chart and see that she had cystitis in August 2022; history of ocular syphilis.  Laboratory results demonstrate leukocytosis, nitrite and leukocyte positive UTI.  CT scan consistent with pyelonephritis; hydronephrosis with unknown etiology.  Patient meets SIRS criteria; will initiate IV fluid resuscitation, IV Toradol for pain, IV Rocephin.  Will consult and discussed with hospitalist services for admission.  Patient would benefit from inpatient urology consultation.   FINAL CLINICAL IMPRESSION(S) / ED DIAGNOSES   Final diagnoses:  SIRS (systemic inflammatory response syndrome) (HCC)  Pyelonephritis  Hydronephrosis, unspecified hydronephrosis type  Nephrolithiasis     Rx / DC Orders   ED Discharge Orders     None        Note:  This document was prepared using Dragon voice recognition software and may include unintentional dictation errors.   Paulette Blanch, MD 05/28/21 [phone]

## 2021-05-28 NOTE — Progress Notes (Signed)
°   05/28/21 1631  Assess: MEWS Score  Temp (!) 103 F (39.4 C)  BP 125/68  Pulse Rate (!) 124  Resp 16  Level of Consciousness Alert  SpO2 100 %  O2 Device Room Air  Assess: MEWS Score  MEWS Temp 2  MEWS Systolic 0  MEWS Pulse 2  MEWS RR 0  MEWS LOC 0  MEWS Score 4  MEWS Score Color Red  Assess: if the MEWS score is Yellow or Red  Were vital signs taken at a resting state? Yes  Focused Assessment Change from prior assessment (see assessment flowsheet)  Does the patient meet 2 or more of the SIRS criteria? Yes  Does the patient have a confirmed or suspected source of infection? Yes  Provider and Rapid Response Notified? Yes  MEWS guidelines implemented *See Row Information* Yes  Treat  Pain Scale 0-10  Pain Score 0  Take Vital Signs  Increase Vital Sign Frequency  Red: Q 1hr X 4 then Q 4hr X 4, if remains red, continue Q 4hrs  Escalate  MEWS: Escalate Red: discuss with charge nurse/RN and provider, consider discussing with RRT  Notify: Charge Nurse/RN  Name of Charge Nurse/RN Notified Theodosia Quay, RN  Date Charge Nurse/RN Notified 05/28/21  Time Charge Nurse/RN Notified 1632  Notify: Provider  Provider Name/Title Dr. Maryfrances Bunnell  Date Provider Notified 05/28/21  Time Provider Notified [phone]  Notification Type  (messaged)  Notification Reason Change in status  Provider response No new orders  Date of Provider Response 05/28/21  Time of Provider Response 1638  Notify: Rapid Response  Name of Rapid Response RN Notified ICU Charge Nurse  Date Rapid Response Notified 05/28/21  Time Rapid Response Notified 1638  Document  Patient Outcome Other (Comment) (pt stable waiting for temp to decrease)  Progress note created (see row info) Yes  Assess: SIRS CRITERIA  SIRS Temperature  1  SIRS Pulse 1  SIRS Respirations  0  SIRS WBC 0  SIRS Score Sum  2

## 2021-05-28 NOTE — Progress Notes (Signed)
Rapid Response Event Note   Reason for Call : [place]   Initial Focused Assessment: On my arrival pt is alert and oriented. HR elevated in the 120s and temp is 103. BP stable. Pt sitting up in bed with no distress noted.   Interventions: Tylenol given, MD notified. VS assessed.   Plan of Care: Pt remains on 1C. Primary RN to reassess temp after administration of Tylenol. Will follow up.   Event Summary:   MD Notified: Dr. Maryfrances Bunnell Call Time: 1632 Arrival Time: 1635 End Time: 1640  Henrene Dodge, RN

## 2021-05-28 NOTE — Consult Note (Signed)
Urology Consult   I have been asked to see the patient by Dr. Arville Care, for evaluation and management of left hydronephrosis, left renal stones, UTI.  Chief Complaint: Left flank and groin pain  HPI:  Vanessa Hester is a 26 y.o. year old female who presented to the ER overnight with severe left-sided flank pain that radiated down to the groin as well as nausea/vomiting.  She is also had some dysuria and urgency/frequency.  She does report some similar episodes of left-sided flank pain in the past.  She also had a very heavy start to her menses which is unusual for her over the last 2 days.  She has been afebrile, was tachycardic on admission but improved with fluids, and she was started on ceftriaxone.  Urinalysis appeared infected on admission today.  She reports she gets 2-3 UTIs per year at baseline, which is actually improved from when she was younger.  She denies any Dietl's crises.  No prior urologic evaluation or surgeries.  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  She has had a number of prior CT scans including 2016, 2017, 2018 that have all showed some left renal dilation likely secondary to UPJ obstruction, but no stones were present at that time.  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  She also has a history of substance abuse  PMH: Past Medical History:  Diagnosis Date   Depression    Febrile seizure (HCC) 10/10/14   When she was an infany   Febrile seizure (HCC)    Ovarian cyst     Surgical History: Past Surgical History:  Procedure Laterality Date   KNEE SURGERY Left    TUMOR REMOVAL  10/10/14   tumor removed from left knee.      Allergies:  Allergies  Allergen Reactions   Other Shortness Of Breath     Medication is "Durahistamine" used for coughing when she was a child   Benadryl [Diphenhydramine] Hives   Gardasil 9 [Hpv 9-Valent Recomb Vaccine]    Amoxicillin Hives   Latex Rash    Family History: Family History  Problem Relation Age of Onset   Migraines Father    Hypertension Father    Cancer Maternal Grandmother 69       colon and uterine   Cancer Maternal Grandfather        prostate   Arthritis Paternal Grandmother    Diabetes Paternal Grandmother    Asthma Paternal Grandfather    Depression Mother    Cancer Maternal Aunt 40       breast   Diabetes Maternal Uncle    Mental illness Paternal Aunt     Social History:  reports that she quit smoking about 4 years ago. Her smoking use included e-cigarettes. She has never used smokeless tobacco. She reports that she does not drink alcohol and does not use drugs.  ROS: Negative aside from those stated in the HPI.  Physical Exam: BP 130/69    Pulse (!) 103    Temp 98 F (36.7 C) (Oral)    Resp 16    LMP 05/25/2021 Comment: neg hcg   SpO2 100%    Constitutional:  Alert and oriented, No acute distress. Cardiovascular: Mildly tachycardic, regular rhythm Respiratory: Clear to  auscultation bilaterally GI: Abdomen is soft, nontender, nondistended, no abdominal masses GU: Left CVA tenderness   Laboratory Data: Reviewed in epic WBC 16.5(20) Creatinine 0.86(1.03) Lactic acid 1.5 Urinalysis 0-5 squamous cells, few bacteria, greater than 50 WBCs, 20-50 RBCs, large leukocytes, nitrite positive   Pertinent Imaging: I have personally reviewed the CT from overnight as well as the multiple CTs from 2016, 2017, 2018.  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  Assessment & Plan:   26 year old female admitted with left-sided flank pain, urinalysis consistent with UTI,  currently improved significantly after antibiotics and pain medications.  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  Clinically, she looks well and is afebrile with no flank pain currently.    Clinically she looks well this morning and denies any pain or nausea at this time.  We discussed options including observation with antibiotics/fluid resuscitation versus cystoscopy with left ureteral stent placement or nephrostomy tube placement for drainage.  Long-term, she will likely require a robotic pyeloplasty with stone removal versus endoscopic procedure if she is not amenable to pyeloplasty.  We discussed stent related symptoms of flank pain, urgency, frequency, dysuria, as well as the challenges of performing a robotic pyeloplasty with stent in place in terms of increased inflammation/edema.  Risks and benefits were discussed extensively.  As she clinically appears well and is improved with antibiotics and pain medication, she would like to defer stent placement at this time which I think is reasonable.  I also discussed her findings with her grandmother who helps take care of her, Lupita Leash, and she is in agreement the plan moving forward.  They understand the importance of close follow-up to discuss left robotic pyeloplasty versus endoscopic management for her left UPJ obstruction and left renal stones once infection is treated.  Recommendations:  -Continue antibiotics and fluid resuscitation, pain meds as needed, follow-up cultures and narrow as able, recommend 2-week course total Bactrim or Cipro would have optimal renal parenchymal penetration -Will re-assess tomorrow need for possible left ureteral stent placement if clinical decompensation -We will arrange outpatient urology follow-up in 2 weeks to discuss left UPJ  obstruction, renal stones, and treatment options  Sondra Come, MD  Total time spent on the floor was 85 minutes, with greater than 50% spent in counseling and coordination of care with the patient regarding UTI, CT findings of likely 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  [facility] [address] [place], [place] [address] [phone]

## 2021-05-28 NOTE — H&P (Signed)
[place]   PATIENT NAME: Vanessa Hester    MR#:  [id number]  DATE OF BIRTH:  [birth year]  DATE OF ADMISSION:  05/28/2021  PRIMARY CARE PHYSICIAN: Pcp, No   Patient is coming from: Home  REQUESTING/REFERRING PHYSICIAN: Chiquita Loth, MD  CHIEF COMPLAINT:   Chief Complaint  Patient presents with   Abdominal Pain    HISTORY OF PRESENT ILLNESS:  Vanessa Hester is a 26 y.o. Caucasian female with medical history significant for depression and seizure, who presented to the emergency room with a [place] of left lower quadrant abdominal pain with radiation to her flank as well as vomiting and chills.  No cough or wheezing or dyspnea.  No chest pain or palpitations.  She admits to urinary frequency and urgency without dysuria. ED Course: Upon presentation to the emergency room heart rate was 128 and respiratory 22 with otherwise normal vital signs.  Labs revealed mild hypokalemia 3.4 and a BUN of 16 with a creatinine 1.03 minimally above previous levels.  CBC showed leukocytosis of 20 with neutrophilia.  Influenza antigens and COVID-19 PCR came back negative.  UA was positive.  Group A strep came back negative.  Blood cultures were drawn.  Imaging: CT renal stone revealed moderate left hydronephrosis and perinephric stranding with decompressed left ureter without stones and layering stones in the dilated renal pelvis measuring up to 12 mm.  The patient was given 15 mg of IV Toradol, IV Rocephin, IV Zofran and 1 L bolus of IV normal saline.  She will be admitted to a medical bed for further evaluation and management. PAST MEDICAL HISTORY:   Past Medical History:  Diagnosis Date   Depression    Febrile seizure (HCC) 10/10/14   When she was an infany   Febrile seizure (HCC)    Ovarian cyst     PAST SURGICAL HISTORY:   Past Surgical History:  Procedure Laterality Date   KNEE SURGERY Left    TUMOR REMOVAL  10/10/14   tumor removed from left knee.     SOCIAL HISTORY:    Social History   Tobacco Use   Smoking status: Former    Types: E-cigarettes    Quit date: 01/07/2017    Years since quitting: 4.3   Smokeless tobacco: Never   Tobacco comments:    1 pack would last 2-3 weeks  Substance Use Topics   Alcohol use: No    FAMILY HISTORY:   Family History  Problem Relation Age of Onset   Migraines Father    Hypertension Father    Cancer Maternal Grandmother 98       colon and uterine   Cancer Maternal Grandfather        prostate   Arthritis Paternal Grandmother    Diabetes Paternal Grandmother    Asthma Paternal Grandfather    Depression Mother    Cancer Maternal Aunt 40       breast   Diabetes Maternal Uncle    Mental illness Paternal Aunt     DRUG ALLERGIES:   Allergies  Allergen Reactions   Other Shortness Of Breath    Medication is "Durahistamine" used for coughing when she was a child   Benadryl [Diphenhydramine] Hives   Gardasil 9 [Hpv 9-Valent Recomb Vaccine]    Amoxicillin Hives   Latex Rash    REVIEW OF SYSTEMS:   ROS As per history of present illness. All pertinent systems were reviewed above. Constitutional, HEENT, cardiovascular, respiratory, GI, GU, musculoskeletal, neuro, psychiatric,  endocrine, integumentary and hematologic systems were reviewed and are otherwise negative/unremarkable except for positive findings mentioned above in the HPI.   MEDICATIONS AT HOME:   Prior to Admission medications   Medication Sig Start Date End Date Taking? Authorizing Provider  acetaminophen (TYLENOL) 325 MG tablet Take 650 mg by mouth every 6 (six) hours as needed for mild pain.    [provider]  Cholecalciferol (VITAMIN D3) 1.25 MG (50000 UT) TABS Take 1 tablet by mouth every 7 (seven) days. Patient not taking: Reported on 04/08/2020 07/04/19   Emi BelfastGessner, Deborah B, FNP  clindamycin (CLEOCIN) 150 MG capsule Take 2 capsules (300 mg total) by mouth 4 (four) times daily. 11/28/20   Gilda CreasePollina, Christopher J, MD  ibuprofen  (ADVIL) 200 MG tablet Take 400 mg by mouth every 6 (six) hours as needed for headache or cramping.    [provider]  ibuprofen (ADVIL,MOTRIN) 800 MG tablet Take 1 tablet (800 mg total) by mouth every 8 (eight) hours as needed. Patient not taking: Reported on 04/08/2020 10/04/17   Faythe GheeFisher, Susan W, PA-C  ondansetron (ZOFRAN-ODT) 4 MG disintegrating tablet Take 1 tablet (4 mg total) by mouth every 8 (eight) hours as needed for nausea or vomiting. Patient not taking: Reported on 04/08/2020 08/12/19   Kem Boroughsriplett, Cari B, FNP  sertraline (ZOLOFT) 100 MG tablet Take 1 tablet (100 mg total) by mouth daily. Patient not taking: Reported on 04/08/2020 07/04/19   Emi BelfastGessner, Deborah B, FNP      VITAL SIGNS:  Blood pressure 130/69, pulse (!) 103, temperature 98 F (36.7 C), temperature source Oral, resp. rate 16, last menstrual period 05/25/2021, SpO2 100 %.  PHYSICAL EXAMINATION:  Physical Exam  GENERAL:  26 y.o.-year-old patient lying in the bed with no acute distress.  EYES: Pupils equal, round, reactive to light and accommodation. No scleral icterus. Extraocular muscles intact.  HEENT: Head atraumatic, normocephalic. Oropharynx and nasopharynx clear.  NECK:  Supple, no jugular venous distention. No thyroid enlargement, no tenderness.  LUNGS: Normal breath sounds bilaterally, no wheezing, rales,rhonchi or crepitation. No use of accessory muscles of respiration.  CARDIOVASCULAR: Regular rate and rhythm, S1, S2 normal. No murmurs, rubs, or gallops.  ABDOMEN: Soft, nondistended, with left lower quadrant abdominal tenderness as well as left CVA tenderness.  No rebound tenderness guarding or rigidity.. Bowel sounds present. No organomegaly or mass.  EXTREMITIES: No pedal edema, cyanosis, or clubbing.  NEUROLOGIC: Cranial nerves II through XII are intact. Muscle strength 5/5 in all extremities. Sensation intact. Gait not checked.  PSYCHIATRIC: The patient is alert and oriented x 3.  Normal affect and  good eye contact. SKIN: No obvious rash, lesion, or ulcer.   LABORATORY PANEL:   CBC Recent Labs  Lab 05/28/21 0659  WBC 16.5*  HGB 11.5*  HCT 34.8*  PLT 257   ------------------------------------------------------------------------------------------------------------------  Chemistries  Recent Labs  Lab 05/27/21 2218  NA 136  K 3.4*  CL 102  CO2 27  GLUCOSE 117*  BUN 16  CREATININE 1.03*  CALCIUM 8.7*  AST 19  ALT 15  ALKPHOS 88  BILITOT 0.6   ------------------------------------------------------------------------------------------------------------------  Cardiac Enzymes No results for input(s): TROPONINI in the last 168 hours. ------------------------------------------------------------------------------------------------------------------  RADIOLOGY:  CT Renal Stone Study  Result Date: 05/28/2021 CLINICAL DATA:  Left lower abdominal pain, flank pain EXAM: CT ABDOMEN AND PELVIS WITHOUT CONTRAST TECHNIQUE: Multidetector CT imaging of the abdomen and pelvis was performed following the standard protocol without IV contrast. RADIATION DOSE REDUCTION: This exam was performed according to  the departmental dose-optimization program which includes automated exposure control, adjustment of the mA and/or kV according to patient size and/or use of iterative reconstruction technique. COMPARISON:  06/27/2016 FINDINGS: Lower chest: No acute abnormality Hepatobiliary: No focal hepatic abnormality. Gallbladder unremarkable. Pancreas: No focal abnormality or ductal dilatation. Spleen: No focal abnormality.  Normal size. Adrenals/Urinary Tract: There is left hydronephrosis and perinephric stranding. The left ureter is decompressed. There are layering stones in the dilated left renal pelvis measuring up to 12 mm. 6 mm left lower pole stone. No ureteral stones are visualized. No stones or hydronephrosis on the right. Adrenal glands and urinary bladder are unremarkable. Stomach/Bowel: Normal  appendix. Stomach, large and small bowel grossly unremarkable. Vascular/Lymphatic: No evidence of aneurysm or adenopathy. Reproductive: Uterus and adnexa unremarkable.  No mass. Other: No free fluid or free air. Musculoskeletal: No acute bony abnormality. IMPRESSION: Moderate left hydronephrosis and perinephric stranding. The left ureter is decompressed without stones. There are layering stones in the dilated renal pelvis measuring up to 12 mm. Exact cause of the hydronephrosis/obstruction not clear at this time. Electronically Signed   By: Charlett Nose M.D.   On: 05/28/2021 01:15      IMPRESSION AND PLAN:  Principal Problem:   Sepsis due to gram-negative UTI (HCC)  1.  Sepsis due to UTI and left pyelonephritis.  The patient has left hydronephrosis and urolithiasis without clear evidence of obstructive uropathy by stone obstruction. - The patient will be admitted to a medical bed. - We will continue antibiotic therapy with IV Rocephin. - We will continue hydration with IV normal saline. - Urology consult will be obtained. - I notified Dr. Geradine Girt about the patient. - Pain management will be provided. - We will keep the patient n.p.o. for now.  2.  Depression. - We will continue Zoloft and trazodone.   DVT prophylaxis: Lovenox. Code Status: full code. Family Communication:  The plan of care was discussed in details with the patient (and family). I answered all questions. The patient agreed to proceed with the above mentioned plan. Further management will depend upon hospital course. Disposition Plan: Back to previous home environment Consults called: Urology. All the records are reviewed and case discussed with ED provider.  Status is: Inpatient   At the time of the admission, it appears that the appropriate admission status for this patient is inpatient.  This is judged to be reasonable and necessary in order to provide the required intensity of service to ensure the patient's safety  given the presenting symptoms, physical exam findings and initial radiographic and laboratory data in the context of comorbid conditions.  The patient requires inpatient status due to high intensity of service, high risk of further deterioration and high frequency of surveillance required.  I certify that at the time of admission, it is my clinical judgment that the patient will require inpatient hospital care extending more than 2 midnights.                            Dispo: The patient is from: Home              Anticipated d/c is to: Home              Patient currently is not medically stable to d/c.              Difficult to place patient: No    Hannah Beat M.D on 05/28/2021 at 7:17 AM  [facility]   From 7 PM-7 AM, contact night-coverage [email or web address]  CC: Primary care physician; Pcp, No

## 2021-05-28 NOTE — Progress Notes (Signed)
This is a no charge note.  For further details please see H&P by Dr. Arville Care from [facility] today.  Patient with chronic ureteral obstruction, admitted with urosepsis.  Seems to be improving.  Follow urine cultures, blood cultures and continue IV antibiotics.  Urology have been consulted, recommend outpatient follow up.

## 2021-05-29 DIAGNOSIS — E876 Hypokalemia: Secondary | ICD-10-CM

## 2021-05-29 DIAGNOSIS — E669 Obesity, unspecified: Secondary | ICD-10-CM

## 2021-05-29 DIAGNOSIS — F32A Depression, unspecified: Secondary | ICD-10-CM

## 2021-05-29 MED ORDER — NAPROXEN 250 MG PO TABS
250.0000 mg | ORAL_TABLET | Freq: Three times a day (TID) | ORAL | Status: DC | PRN
  Filled 2021-05-29: qty 1

## 2021-05-29 MED ORDER — SERTRALINE HCL 50 MG PO TABS
100.0000 mg | ORAL_TABLET | Freq: Every day | ORAL | Status: DC
Start: 2021-05-29 — End: 2021-05-29

## 2021-05-29 NOTE — Assessment & Plan Note (Signed)
Urine culture growing E. coli, blood cultures negative.  Sensitivities pending. - Continue Rocephin

## 2021-05-29 NOTE — Progress Notes (Signed)
° °  Subjective Fever of 103x1 at 1400 yesterday, resolved with Tylenol Complains of headache today, denies any significant left-sided flank pain  Physical Exam: BP 106/69 (BP Location: Left Arm)    Pulse (!) 109    Temp 99.6 F (37.6 C) (Oral)    Resp 16    Ht 5\' 5"  (1.651 m)    Wt 104 kg    LMP 05/25/2021 Comment: neg hcg   SpO2 98%    BMI 38.15 kg/m    Constitutional:  Alert and oriented, No acute distress.  Appears comfortable Respiratory: Normal respiratory effort, no increased work of breathing. GI: Abdomen is soft, non-tender, non-distended  Laboratory Data: Reviewed  Assessment & Plan:   26 year old female admitted with left-sided flank pain, urinalysis consistent with UTI, improving with antibiotics and pain medications.  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    We again discussed options including left ureteral stent placement for drainage of the collecting system, but potential stent related symptoms as well as challenges for reconstructive repair in the future.  She currently denies significant left-sided flank pain, and I think avoiding a stent is reasonable at this time if she continues to improve.  -Continue antibiotics, follow-up urine culture -Okay for diet today -Will arrange for outpatient urology follow-up in 1 to 2 weeks to re- discuss left robotic pyeloplasty and stone removal for left UPJ obstruction  07/26/2021, MD

## 2021-05-29 NOTE — Assessment & Plan Note (Signed)
-   Continue trazodone, sertraline

## 2021-05-29 NOTE — Progress Notes (Signed)
°  Progress Note   Patient: Vanessa Hester VCB:[id number] DOB: [birth year] DOA: 05/28/2021     1 DOS: the patient was seen and examined on 05/29/2021        Brief hospital course: Ms Patin is a 26 y.o. F with no sig PMHx who presented with left-sided flank pain, urinalysis consistent with UTI.    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  Urology consulted, recommended medical management and outpatient work up for surgical revision.       Assessment and Plan * Sepsis due to gram-negative UTI (HCC)- (present on admission) Urine culture growing E. coli, blood cultures negative.  Sensitivities pending. - Continue Rocephin  Hypokalemia- (present on admission) Treated and resolved.  Depression- (present on admission) - Continue trazodone, sertraline  Obesity BMI 38        Subjective: Patient had a fever last night.  No vomiting today.  She still has moderate pain in the left side, which comes and goes.  Well controlled with Toradol.     Objective Signs reviewed and remarkable for elevated heart rate, persistent fever. General appearance: adult female, lying in bed,  no acute distress.       Cardiac: RRR, no murmurs, no lower extremity edema Respiratory: Normal respiratory rate and rhythm, lungs clear without rales or wheezes Abdomen: Abdomen with mild tenderness on the left, no CVA tenderness, no rigidity or rebound Neuro: Upper extremity movements intact, face symmetric, speech fluent Psych: Attention normal, affect normal, judgment Syprine normal    Data Reviewed:  There are no new results to review at this time.  Family Communication: Mother at the bedside  Disposition: Status is: Inpatient  Remains inpatient appropriate because: She had a severe infection, and ongoing fever.  We will wait for sensitivities prior to narrowing to oral  antibiotics, likely discharge home tomorrow             Author: Alberteen Sam, MD 05/29/2021 4:02 PM  For on call review [email or web address].

## 2021-05-29 NOTE — Assessment & Plan Note (Signed)
Treated and resolved °

## 2021-05-29 NOTE — Hospital Course (Signed)
Ms Hindes is a 26 y.o. F with no sig PMHx who presented with left-sided flank pain, urinalysis consistent with UTI.    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  Urology consulted, recommended medical management and outpatient work up for surgical revision.

## 2021-05-30 DIAGNOSIS — N10 Acute tubulo-interstitial nephritis: Secondary | ICD-10-CM

## 2021-05-30 DIAGNOSIS — N131 Hydronephrosis with ureteral stricture, not elsewhere classified: Secondary | ICD-10-CM

## 2021-05-30 LAB — CBC
HCT: 33.4 % — ABNORMAL LOW (ref 36.0–46.0)
Hemoglobin: 10.6 g/dL — ABNORMAL LOW (ref 12.0–15.0)
MCH: 27.9 pg (ref 26.0–34.0)
MCHC: 31.7 g/dL (ref 30.0–36.0)
MCV: 87.9 fL (ref 80.0–100.0)
Platelets: 268 10*3/uL (ref 150–400)
RBC: 3.8 MIL/uL — ABNORMAL LOW (ref 3.87–5.11)
RDW: 15.3 % (ref 11.5–15.5)
WBC: 9.3 10*3/uL (ref 4.0–10.5)
nRBC: 0 % (ref 0.0–0.2)

## 2021-05-30 LAB — URINE CULTURE: Culture: >=100000 — AB

## 2021-05-30 LAB — BASIC METABOLIC PANEL
Anion gap: 7 (ref 5–15)
BUN: 13 mg/dL (ref 6–20)
CO2: 23 mmol/L (ref 22–32)
Calcium: 8.3 mg/dL — ABNORMAL LOW (ref 8.9–10.3)
Chloride: 106 mmol/L (ref 98–111)
Creatinine, Ser: 0.98 mg/dL (ref 0.44–1.00)
GFR, Estimated: >60 mL/min (ref >60)
Glucose, Bld: 92 mg/dL (ref 70–99)
Potassium: 4.1 mmol/L (ref 3.5–5.1)
Sodium: 136 mmol/L (ref 135–145)

## 2021-05-30 MED ORDER — OXYCODONE HCL 5 MG PO TABS
5.0000 mg | ORAL_TABLET | Freq: Three times a day (TID) | ORAL | 0 refills | Status: DC | PRN
Start: 2021-05-30 — End: 2021-08-25

## 2021-05-30 MED ORDER — CEFDINIR 300 MG PO CAPS: 300.0000 mg | ORAL_CAPSULE | Freq: Two times a day (BID) | ORAL | 0 refills | Status: DC

## 2021-05-30 MED ORDER — TRAZODONE HCL 50 MG PO TABS
50.0000 mg | ORAL_TABLET | Freq: Every day | ORAL | 3 refills | Status: AC
Start: 2021-05-30 — End: ?

## 2021-05-30 NOTE — Assessment & Plan Note (Addendum)
P/w heart rate 128, RR 22, leukocytosis.  NO end organ damage, sepsis ruled out.    Urine culture growing E. coli, pan-sensitive. blood cultures negative.    Treated with ROcephin, discharged on cefdinir to complete 14 days.

## 2021-05-30 NOTE — Discharge Summary (Signed)
Physician Discharge Summary   Patient: Vanessa Hester MRN: [id number] DOB: [birth year]  Admit date:     05/28/2021  Discharge date: 05/30/21  Discharge Physician: Alberteen Sam   PCP: Pcp, No   Recommendations at discharge:   Follow up with Dr. Richardo Hanks, Urology, as directed     Discharge Diagnoses Principal Problem:   Acute pyelonephritis Active Problems:   Obesity (BMI 35.0-39.9 without comorbidity)   Hydronephrosis due to obstruction of ureter   Depression   Hypokalemia   Hospital Course   Vanessa Hester is a 26 y.o. F with no sig PMHx who presented with left-sided flank pain, urinalysis consistent with UTI.    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  Urology consulted, recommended medical management and outpatient work up for surgical revision.     * Acute pyelonephritis- (present on admission) P/w heart rate 128, RR 22, leukocytosis.  NO end organ damage, sepsis ruled out.    Urine culture growing E. coli, pan-sensitive. blood cultures negative.    Treated with ROcephin, discharged on cefdinir to complete 14 days.    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  Hypokalemia- (present on admission) Treated and resolved.  Depression- (present on admission)        Pain control - [place] Controlled Substance Reporting System database was reviewed. and patient was instructed, not to drive, operate heavy machinery, perform activities at heights, swimming or participation in water activities or provide baby-sitting services while on Pain, Sleep and Anxiety Medications; until their outpatient Physician has advised to do so again. Also recommended to not to take more than prescribed Pain, Sleep and Anxiety Medications.    Consultants: Urology Procedures performed:   Disposition: Home Diet recommendation: Regular diet  DISCHARGE MEDICATION: Allergies as of 05/30/2021       Reactions   Other Shortness Of Breath   Medication is "Durahistamine" used for coughing when she was a child   Benadryl [diphenhydramine] Hives   Gardasil 9 [hpv 9-valent Recomb Vaccine]    Amoxicillin Hives   Latex Rash        Medication List     STOP taking these medications    clindamycin 150 MG capsule Commonly known as: CLEOCIN   dorzolamide-timolol 22.3-6.8 MG/ML ophthalmic solution Commonly known as: COSOPT   ibuprofen 200 MG tablet Commonly known as: ADVIL   ibuprofen 800 MG tablet Commonly known as: ADVIL   ondansetron 4 MG disintegrating tablet Commonly known as: ZOFRAN-ODT   sertraline 100 MG tablet Commonly known as: ZOLOFT   Vitamin D3 1.25 MG (50000 UT) Tabs       TAKE these medications    acetaminophen 325 MG tablet Commonly known as: TYLENOL Take 650 mg by mouth every 6 (six) hours as needed for mild pain.   cefdinir 300 MG capsule Commonly known as: OMNICEF Take 1 capsule (300 mg total) by mouth 2 (two) times daily.   oxyCODONE 5 MG immediate release tablet Commonly known as: Roxicodone Take 1 tablet (5 mg total) by mouth every 8 (eight) hours as needed.   traZODone 50 MG tablet Commonly known as: DESYREL Take 1 tablet (50 mg total) by mouth at bedtime. What changed: when to take this        Follow-up Information     Sondra Come, MD Follow up.  Specialty: Urology Why: Follow up as instructed, call to confirm appointment Contact information: [address] [place] [address] [phone]                Discharge Instructions     Discharge instructions   Complete by: As directed    From Dr. Maryfrances Bunnellanford: You were admitted to the hospital for kidney infection This was from the obstruction of your right 'ureter' which is the thin tube that drains  urine from the kidney in your left upper back down to the bladder in your pelvis.  Your urine culture was growing E coli, which is normal Take the antibiotic cefdinir for 10 more days Take cefdinir 300 mg twice daily until gone Call Dr. Keane ScrapeSninsky's office (the urologist) to confirm your appointment  For pain, probably you should take the non-opioid pain medicine we gave you here (that was naproxen, a pill, and toradol, an injection).  These are both NSAIDs, like ibuprofen, which means they are anti inflammatories and the most effective medicine for what you have.  To continue this, You may take naproxen (which is the same as Aleve) over the counter. Take one tablet up to twice daily as needed.  If you need this longer than 2 weeks, tell a doctor  If the naproxen doesn't work, you may take the opioid pain medicine oxycodone.  Do not take this with sedating medicines or alcohol, as it can suppress breathing and cause death.   Increase activity slowly   Complete by: As directed         Discharge Exam: Filed Weights   05/28/21 1500  Weight: 104 kg   General: Pt is alert, awake, not in acute distress Cardiovascular: RRR, nl S1-S2, no murmurs appreciated.   No LE edema.   Respiratory: Normal respiratory rate and rhythm.  CTAB without rales or wheezes. Abdominal: Abdomen soft and non-tender.  No distension or HSM.   Neuro/Psych: Strength symmetric in upper and lower extremities.  Judgment and insight appear normal.   Condition at discharge: good  The results of significant diagnostics from this hospitalization (including imaging, microbiology, ancillary and laboratory) are listed below for reference.   Imaging Studies: CT Renal Stone Study  Result Date: 05/28/2021 CLINICAL DATA:  Left lower abdominal pain, flank pain EXAM: CT ABDOMEN AND PELVIS WITHOUT CONTRAST TECHNIQUE: Multidetector CT imaging of the abdomen and pelvis was performed following the standard protocol without IV  contrast. RADIATION DOSE REDUCTION: This exam was performed according to the departmental dose-optimization program which includes automated exposure control, adjustment of the mA and/or kV according to patient size and/or use of iterative reconstruction technique. COMPARISON:  06/27/2016 FINDINGS: Lower chest: No acute abnormality Hepatobiliary: No focal hepatic abnormality. Gallbladder unremarkable. Pancreas: No focal abnormality or ductal dilatation. Spleen: No focal abnormality.  Normal size. Adrenals/Urinary Tract: There is left hydronephrosis and perinephric stranding. The left ureter is decompressed. There are layering stones in the dilated left renal pelvis measuring up to 12 mm. 6 mm left lower pole stone. No ureteral stones are visualized. No stones or hydronephrosis on the right. Adrenal glands and urinary bladder are unremarkable. Stomach/Bowel: Normal appendix. Stomach, large and small bowel grossly unremarkable. Vascular/Lymphatic: No evidence of aneurysm or adenopathy. Reproductive: Uterus and adnexa unremarkable.  No mass. Other: No free fluid or free air. Musculoskeletal: No acute bony abnormality. IMPRESSION: Moderate left hydronephrosis and perinephric stranding. The left ureter is decompressed without stones. There are layering stones in the dilated renal pelvis measuring up to 12  mm. Exact cause of the hydronephrosis/obstruction not clear at this time. Electronically Signed   By: Charlett NoseKevin  Dover M.D.   On: 05/28/2021 01:15    Microbiology: Results for orders placed or performed during the hospital encounter of 05/28/21  Urine Culture     Status: Abnormal   Collection Time: 05/27/21 10:18 PM   Specimen: Urine, Random  Result Value Ref Range Status   Specimen Description   Final    URINE, RANDOM Performed at [facility], [address] [place], [place] [address]    Special Requests   Final    NONE Performed at [facility], [address] [place], [place]  [address]    Culture (A)  Final    >=100,000 COLONIES/mL ESCHERICHIA COLI 50,000 COLONIES/mL GROUP B STREP(S.AGALACTIAE)ISOLATED TESTING AGAINST S. AGALACTIAE NOT ROUTINELY PERFORMED DUE TO PREDICTABILITY OF AMP/PEN/VAN SUSCEPTIBILITY. Performed at [facility], [address] [place], [place] [address]    Report Status 05/30/2021 FINAL  Final   Organism ID, Bacteria ESCHERICHIA COLI (A)  Final      Susceptibility   Escherichia coli - MIC*    AMPICILLIN 4 SENSITIVE Sensitive     CEFAZOLIN <=4 SENSITIVE Sensitive     CEFEPIME <=0.12 SENSITIVE Sensitive     CEFTRIAXONE <=0.25 SENSITIVE Sensitive     CIPROFLOXACIN <=0.25 SENSITIVE Sensitive     GENTAMICIN <=1 SENSITIVE Sensitive     IMIPENEM <=0.25 SENSITIVE Sensitive     NITROFURANTOIN <=16 SENSITIVE Sensitive     TRIMETH/SULFA <=20 SENSITIVE Sensitive     AMPICILLIN/SULBACTAM <=2 SENSITIVE Sensitive     * >=100,000 COLONIES/mL ESCHERICHIA COLI  Culture, blood (routine x 2)     Status: None (Preliminary result)   Collection Time: 05/28/21  4:45 AM   Specimen: BLOOD  Result Value Ref Range Status   Specimen Description BLOOD RIGHT ANTECUBITAL  Final   Special Requests   Final    BOTTLES DRAWN AEROBIC AND ANAEROBIC Blood Culture adequate volume   Culture   Final    NO GROWTH 2 DAYS Performed at [facility], [address] [place], [place] [address]    Report Status PENDING  Incomplete  Culture, blood (routine x 2)     Status: None (Preliminary result)   Collection Time: 05/28/21  4:45 AM   Specimen: BLOOD  Result Value Ref Range Status   Specimen Description BLOOD LEFT ANTECUBITAL  Final   Special Requests   Final    BOTTLES DRAWN AEROBIC AND ANAEROBIC Blood Culture results may not be optimal due to an excessive volume of blood received in culture bottles   Culture   Final    NO GROWTH 2 DAYS Performed at [facility], [address] [place], [place] [address]    Report Status PENDING  Incomplete   Resp Panel by RT-PCR (Flu A&B, Covid) Nasopharyngeal Swab     Status: None   Collection Time: 05/28/21  5:01 AM   Specimen: Nasopharyngeal Swab; Nasopharyngeal(NP) swabs in vial transport medium  Result Value Ref Range Status   SARS Coronavirus 2 by RT PCR NEGATIVE NEGATIVE Final    Comment: (NOTE) SARS-CoV-2 target nucleic acids are NOT DETECTED.  The SARS-CoV-2 RNA is generally detectable in upper respiratory specimens during the acute phase of infection. The lowest concentration of SARS-CoV-2 viral copies this assay can detect is 138 copies/mL. A negative result does not preclude SARS-Cov-2 infection and should not be used as the sole basis for treatment or other patient management  decisions. A negative result may occur with  improper specimen collection/handling, submission of specimen other than nasopharyngeal swab, presence of viral mutation(s) within the areas targeted by this assay, and inadequate number of viral copies(<138 copies/mL). A negative result must be combined with clinical observations, patient history, and epidemiological information. The expected result is Negative.  Fact Sheet for Patients:  BloggerCourse.com  Fact Sheet for Healthcare Providers:  SeriousBroker.it  This test is no t yet approved or cleared by the [place] FDA and  has been authorized for detection and/or diagnosis of SARS-CoV-2 by FDA under an Emergency Use Authorization (EUA). This EUA will remain  in effect (meaning this test can be used) for the duration of the COVID-19 declaration under Section 564(b)(1) of the Act, 21 U.S.C.section 360bbb-3(b)(1), unless the authorization is terminated  or revoked sooner.       Influenza A by PCR NEGATIVE NEGATIVE Final   Influenza B by PCR NEGATIVE NEGATIVE Final    Comment: (NOTE) The Xpert Xpress SARS-CoV-2/FLU/RSV plus assay is intended as an aid in the diagnosis of influenza from  Nasopharyngeal swab specimens and should not be used as a sole basis for treatment. Nasal washings and aspirates are unacceptable for Xpert Xpress SARS-CoV-2/FLU/RSV testing.  Fact Sheet for Patients: BloggerCourse.com  Fact Sheet for Healthcare Providers: SeriousBroker.it  This test is not yet approved or cleared by the [place] FDA and has been authorized for detection and/or diagnosis of SARS-CoV-2 by FDA under an Emergency Use Authorization (EUA). This EUA will remain in effect (meaning this test can be used) for the duration of the COVID-19 declaration under Section 564(b)(1) of the Act, 21 U.S.C. section 360bbb-3(b)(1), unless the authorization is terminated or revoked.  Performed at [facility], [address] [place], [place] [address]     Labs: CBC: Recent Labs  Lab 05/27/21 2218 05/28/21 0659 05/30/21 0647  WBC 20.0*   20.3* 16.5* 9.3  NEUTROABS 17.0*  --   --   HGB 13.2   13.3 11.5* 10.6*  HCT 41.8   41.2 34.8* 33.4*  MCV 86.7   86.6 84.5 87.9  PLT 367   356 257 268   Basic Metabolic Panel: Recent Labs  Lab 05/27/21 2218 05/28/21 0659 05/30/21 0647  NA 136 140 136  K 3.4* 3.8 4.1  CL 102 106 106  CO2 27 26 23   GLUCOSE 117* 117* 92  BUN 16 14 13   CREATININE 1.03* 0.86 0.98  CALCIUM 8.7* 8.1* 8.3*   Liver Function Tests: Recent Labs  Lab 05/27/21 2218  AST 19  ALT 15  ALKPHOS 88  BILITOT 0.6  PROT 7.3  ALBUMIN 3.8   CBG: No results for input(s): GLUCAP in the last 168 hours.  Discharge time spent: 25 minutes.  Signed: , MD [facility] 05/30/2021

## 2021-05-30 NOTE — Plan of Care (Signed)
°  Problem: Education: Goal: Knowledge of General Education information will improve Description: Including pain rating scale, medication(s)/side effects and non-pharmacologic comfort measures Outcome: Progressing   Problem: Health Behavior/Discharge Planning: Goal: Ability to manage health-related needs will improve Outcome: Progressing   Problem: Clinical Measurements: Goal: Ability to maintain clinical measurements within normal limits will improve Outcome: Progressing Goal: Will remain free from infection Outcome: Progressing Goal: Diagnostic test results will improve Outcome: Progressing Goal: Respiratory complications will improve Outcome: Progressing Goal: Cardiovascular complication will be avoided Outcome: Progressing   Problem: Activity: Goal: Risk for activity intolerance will decrease Outcome: Progressing   Problem: Nutrition: Goal: Adequate nutrition will be maintained Outcome: Progressing   Problem: Coping: Goal: Level of anxiety will decrease Outcome: Progressing   Problem: Elimination: Goal: Will not experience complications related to bowel motility Outcome: Progressing Goal: Will not experience complications related to urinary retention Outcome: Progressing   Problem: Pain Managment: Goal: General experience of comfort will improve Outcome: Progressing   Problem: Safety: Goal: Ability to remain free from injury will improve Outcome: Progressing   Problem: Skin Integrity: Goal: Risk for impaired skin integrity will decrease Outcome: Progressing   Problem: Education: Goal: Knowledge of General Education information will improve Description: Including pain rating scale, medication(s)/side effects and non-pharmacologic comfort measures Outcome: Progressing   Problem: Health Behavior/Discharge Planning: Goal: Ability to manage health-related needs will improve Outcome: Progressing   Problem: Clinical Measurements: Goal: Ability to maintain  clinical measurements within normal limits will improve Outcome: Progressing Goal: Will remain free from infection Outcome: Progressing Goal: Diagnostic test results will improve Outcome: Progressing Goal: Respiratory complications will improve Outcome: Progressing Goal: Cardiovascular complication will be avoided Outcome: Progressing   Problem: Activity: Goal: Risk for activity intolerance will decrease Outcome: Progressing   Problem: Nutrition: Goal: Adequate nutrition will be maintained Outcome: Progressing   Problem: Coping: Goal: Level of anxiety will decrease Outcome: Progressing   Problem: Elimination: Goal: Will not experience complications related to bowel motility Outcome: Progressing Goal: Will not experience complications related to urinary retention Outcome: Progressing   Problem: Pain Managment: Goal: General experience of comfort will improve Outcome: Progressing   Problem: Safety: Goal: Ability to remain free from injury will improve Outcome: Progressing   Problem: Skin Integrity: Goal: Risk for impaired skin integrity will decrease Outcome: Progressing   Problem: Fluid Volume: Goal: Hemodynamic stability will improve Outcome: Progressing   Problem: Clinical Measurements: Goal: Diagnostic test results will improve Outcome: Progressing Goal: Signs and symptoms of infection will decrease Outcome: Progressing   Problem: Respiratory: Goal: Ability to maintain adequate ventilation will improve Outcome: Progressing   Problem: Urinary Elimination: Goal: Signs and symptoms of infection will decrease Outcome: Progressing

## 2021-05-30 NOTE — Assessment & Plan Note (Signed)
Treated and resolved °

## 2021-05-30 NOTE — Progress Notes (Signed)
IV removed, AVS given by Paulino Rily. Patient awaiting for ride.

## 2021-05-30 NOTE — Assessment & Plan Note (Signed)
Evaluated by Urology who recommended outpatient follow up for possible surgical revision, no acute intervention needed.

## 2021-06-01 ENCOUNTER — Telehealth: Payer: Self-pay | Admitting: Urology

## 2021-06-01 NOTE — Telephone Encounter (Signed)
Tried to contact pt to set up f/u appt and no v/m set up, unable to leave message.

## 2021-06-02 LAB — CULTURE, BLOOD (ROUTINE X 2)
Culture: NO GROWTH
Culture: NO GROWTH
Special Requests: ADEQUATE

## 2021-06-17 ENCOUNTER — Encounter: Payer: Self-pay | Admitting: Urology

## 2021-06-17 ENCOUNTER — Ambulatory Visit (INDEPENDENT_AMBULATORY_CARE_PROVIDER_SITE_OTHER): Payer: Self-pay | Admitting: Urology

## 2021-06-17 ENCOUNTER — Other Ambulatory Visit: Payer: Self-pay

## 2021-06-17 ENCOUNTER — Other Ambulatory Visit: Payer: Self-pay | Admitting: Urology

## 2021-06-17 VITALS — BP 139/84 | HR 91 | Ht 65.0 in | Wt 230.0 lb

## 2021-06-17 DIAGNOSIS — N135 Crossing vessel and stricture of ureter without hydronephrosis: Secondary | ICD-10-CM

## 2021-06-17 DIAGNOSIS — N390 Urinary tract infection, site not specified: Secondary | ICD-10-CM

## 2021-06-17 DIAGNOSIS — N200 Calculus of kidney: Secondary | ICD-10-CM

## 2021-06-17 NOTE — Progress Notes (Signed)
Surgical Physician Order Form [facility] [place]  * Scheduling expectation : 4 to 8 weeks  *Length of Case: 3 hours  *Clearance needed: no  *Anticoagulation Instructions: Hold all anticoagulants  *Aspirin Instructions: Hold Aspirin  *Post-op visit Date/Instructions: 6 weeks stent removal  *Diagnosis: Left UPJ obstruction, left nephrolithiasis  *Procedure: left robotic pyeloplasty   Additional orders: N/A  -Admit type: Observation  -Anesthesia: General  -VTE Prophylaxis Standing Order SCDs       Other:   -Standing Lab Orders Per Anesthesia    Lab other: UA&Urine Culture, needs urine drug screen 1 to 2 weeks prior  -Standing Test orders EKG/Chest x-ray per Anesthesia       Test other:   - Medications:  Ancef 2gm IV  -Other orders:  N/A

## 2021-06-17 NOTE — Patient Instructions (Addendum)
Robot-Assisted Ureterolysis and Pyeloplasty Robotic-assisted ureterolysis and pyeloplasty are two procedures to relieve a blockage in the ureter or the ureteropelvic junction (UPJ). Ureters are tubes that connect the organs that make urine (kidneys) to the organ that stores urine (bladder). The UPJ is the connection between the ureter and part of the kidney (renal pelvis). During these procedures, a computer is used to control surgical instruments that are attached to robotic arms. Pyeloplasty may be done when the ureter is blocked or constricted by something inside of the UPJ. The blockage may be caused by scar tissue, a fluid-filled sac (cyst), a growth, or a kidney stone. The blockage may also be caused by a blood vessel crossing in front of the UPJ. During pyeloplasty, the blockage is removed, unless the blockage is caused by a blood vessel. If the blockage is caused by a blood vessel, the blood vessel is moved away from the UPJ. Tell a health care provider about: Any allergies you have. All medicines you are taking, including vitamins, herbs, eye drops, creams, and over-the-counter medicines. Any problems you or family members have had with anesthetic medicines. Any blood disorders you have. Any surgeries you have had. Any medical conditions you have. Whether you are pregnant or may be pregnant. What are the risks? Generally, this is a safe procedure. However, problems may occur, including: Infection. Bleeding. Allergic reactions to medicines. Damage to other structures or organs, such as the ureter or the intestines. A return of the urine blockage. Urine leakage. Blood clots. The need to switch to an open surgery. An open surgery means that the surgeon will make a larger incision and perform the procedure by hand. Your surgeon will decide if this is necessary during the procedure. What happens before the procedure? Staying hydrated Follow instructions from your health care provider about  hydration, which may include: Up to 2 hours before the procedure - you may continue to drink clear liquids, such as water, clear fruit juice, black coffee, and plain tea.  Eating and drinking restrictions Follow instructions from your health care provider about eating and drinking, which may include: 8 hours before the procedure - stop eating heavy meals or foods, such as meat, fried foods, or fatty foods. 6 hours before the procedure - stop eating light meals or foods, such as toast or cereal. 6 hours before the procedure - stop drinking milk or drinks that contain milk. 2 hours before the procedure - stop drinking clear liquids. Medicines Ask your health care provider about: Changing or stopping your regular medicines. This is especially important if you are taking diabetes medicines or blood thinners. Taking medicines such as aspirin and ibuprofen. These medicines can thin your blood. Do not take these medicines unless your health care provider tells you to take them. Taking over-the-counter medicines, vitamins, herbs, and supplements. General instructions You may be asked to bathe or shower using a soap that kills skin bacteria. Ask your health care provider: How your surgical site will be marked. What steps will be taken to help prevent infection. These may include: Removing hair at the surgery site. Washing skin with a germ-killing soap. Taking antibiotic medicine. Plan to have someone take you home from the hospital or clinic. What happens during the procedure? An IV will be inserted into one of your veins. You may be given a medicine to help you relax (sedative). You will be given a medicine to make you fall asleep (general anesthetic). A thin, flexible tube (urinarycatheter) will be passed through your  urethra and into your bladder. The catheter will drain urine from your bladder during the procedure. Several small incisions will be made in your abdomen. A thin tube with a light  and a small camera on the end (laparoscope) and other surgical instruments will be passed through your incisions to perform the procedure. Carbon dioxide gas may be put into your abdomen. This stretches your abdomen so that your surgeon can see your organs. Depending on which procedure needs to be done, your surgeon will do the following: For a pyeloplasty, the surgeon will remove the blockage by making an incision in your ureter or your UPJ and removing the blockage, or by moving the blood vessel that is causing the blockage. In some cases, part of your ureter may be removed, or you may need to have a soft plastic tube (stent) inserted into your ureter to prevent urine from draining out of the incision in your ureter while the incision heals. If an incision is made in your ureter or your UPJ, the incision will be closed with stitches (sutures). For a ureterolysis, the surgeon will move your ureter away from the source of the blockage. You may not need a stent. If the block is caused by kidney stones, these will be removed. A drainage tube may be placed in your abdomen. The incisions in your abdomen may be closed with sutures, skin glue, or adhesive tape. Incisions may be covered with bandages (dressings). The procedure may vary among health care providers and hospitals. What happens after the procedure? Your blood pressure, heart rate, breathing rate, and blood oxygen level will be monitored until you leave the hospital or clinic. You may continue to receive fluids and medicine through an IV. You will have some pain. Pain medicines will be available. You will be encouraged to walk around as soon as possible. You may have to wear compression stockings. These stockings help to prevent blood clots and reduce swelling in your legs. You will have a catheter draining your urine. You may have a tube draining fluid from your surgical area. Summary Robotic-assisted ureterolysis and pyeloplasty are two  procedures to relieve a blockage in the ureter or the ureteropelvic junction (UPJ). During these procedures, a computer is used to control surgical instruments that are attached to robotic arms. Follow instructions from your health care provider about taking medicines and about eating and drinking before the procedure. After the procedure, you may have a tube draining fluid from your surgical area. This information is not intended to replace advice given to you by your health care provider. Make sure you discuss any questions you have with your health care provider. Document Revised: 07/18/2018 Document Reviewed: 07/18/2018 Elsevier Patient Education  [place] Ureterolysis and Pyeloplasty, Care After This sheet gives you information about how to care for yourself after your procedure. Your health care provider may also give you more specific instructions. If you have problems or questions, contact your health care provider. What can I expect after the procedure? After the procedure, it is common to have: Mild pain under your rib cage, in your back, or in your shoulder. Nausea for up to 2 days. Decreased appetite for up to 1 week. Soreness and mild pain in your abdomen. A small amount of blood or clear fluid coming from your incisions. Fewer bowel movements than usual (constipation) and moderate discomfort due to gas. This may last for several days. Soreness or mild discomfort from your urinary catheter. After your catheter is  removed, you may have mild soreness, especially when urinating. A small amount of blood in your urine. This may last for several days. Follow these instructions at home: Medicines Take over-the-counter and prescription medicines only as told by your health care provider. Ask your health care provider if the medicine prescribed to you requires you to avoid driving or using heavy machinery. If you were prescribed an antibiotic medicine, take it  as told by your health care provider. Do not stop taking the antibiotic even if you start to feel better. Eating and drinking Avoid any foods or drinks that cause gas or abdominal discomfort. Drink enough fluid to keep your urine pale yellow. Do not drink alcohol for as long as told by your health care provider. This is especially important if you are taking prescription pain medicines. Managing constipation Your procedure may cause constipation. To prevent or treat constipation, you may need to: Take over-the-counter or prescription medicines. Eat foods that are high in fiber, such as beans, whole grains, and fresh fruits and vegetables. Limit foods that are high in fat and processed sugars, such as fried or sweet foods. Incision and drainage tube care  Follow instructions from your health care provider about how to take care of your incisions. Make sure you: Wash your hands with soap and water before and after you change your bandages (dressings). If soap and water are not available, use hand sanitizer. Change your dressings as told by your health care provider. Leave stitches (sutures), skin glue, or adhesive strips in place. These skin closures may need to stay in place for 2 weeks or longer. If adhesive strip edges start to loosen and curl up, you may trim the loose edges. Do not remove adhesive strips completely unless your health care provider tells you to do that. Keep your incision areas clean and dry. Check your incision areas every day for signs of infection. Check for: Redness or swelling. More pain. More fluid or blood. Warmth. Pus or a bad smell. If you have a drainage tube, follow instructions from your health care provider about how to care for it. Do not remove the tube yourself. Change the dressing around the tube as told by your health care provider. Write down how much fluid drains each day. Note any changes in how the fluid looks or smells. Activity Rest as told by  your health care provider. Avoid sitting for a long time without moving. Get up to take short walks every 1-2 hours. This is important to improve blood flow and breathing. Ask for help if you feel weak or unsteady. Do not lift anything that is heavier than 10 lb (4.5 kg), or the limit that you are told, until your health care provider says that it is safe. Return to your normal activities as told by your health care provider. Ask your health care provider what activities are safe for you. General instructions Do not take baths, swim, or use a hot tub until your health care provider approves. Ask your health care provider if you may take showers. You may only be allowed to take sponge baths. If you have a urinary catheter, follow instructions from your health care provider about caring for your catheter and your drainage bag. Wear compression stockings as told by your health care provider. These stockings help to prevent blood clots and reduce swelling in your legs. Keep all follow-up visits as told by your health care provider. This is important. Contact a health care provider if you: Feel  nauseous for more than 2 days after your procedure. Vomit. Develop a cough. Have pain that gets worse or does not get better with medicine. Have difficulty urinating. Have pain when you urinate. Have an increased amount of blood in your urine. Have not had a bowel movement in more than 2 days. Have a fever. Have signs of infection in the incision areas, such as: Redness or swelling. More pain. More fluid or blood. An incision that feels warm to the touch. Pus or a bad smell. Get help right away if: You have chest pain. You have swelling, redness, or pain in your legs. You have severe pain. Your urinary catheter has been removed and you are not able to urinate. You have a urinary catheter in place and the catheter is not draining urine. Summary After the procedure, it is common to have nausea and  mild pain in your abdomen. Follow instructions from your health care provider about how to take care of your incisions. Drink enough fluids to keep your urine pale yellow. Return to your normal activities as told by your health care provider. Ask your health care provider what activities are safe for you. This information is not intended to replace advice given to you by your health care provider. Make sure you discuss any questions you have with your health care provider. Document Revised: 07/18/2018 Document Reviewed: 07/18/2018 Elsevier Patient Education  [place].

## 2021-06-17 NOTE — Progress Notes (Signed)
° °  06/17/2021 4:09 PM   Vanessa Hester 08/19/1995 [id number]  Reason for visit: Left UPJ obstruction, left renal stones, recent admission for left pyelonephritis  HPI: 26 year old female with no prior abdominal surgeries who was admitted on 05/28/2021 with left flank pain, pyelonephritis, and fever.  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  She was discharged on cefdinir and has been doing well.  She continues to have some intermittent left-sided flank pain, but denies fevers, chills, or UTI symptoms.  She had multiple CTs in 2016, 2017, and 2018 that showed worsening left renal dilation likely secondary to the progressing UPJ obstruction, but the renal stones are new indicating progressive obstruction.  We discussed options including observation, attempted endoscopic management, or definitive treatment with left robotic pyeloplasty and stone removal.  I recommended pursuing robotic pyeloplasty as the most definitive treatment.  We discussed this procedure takes 2 to 3 hours and this done robotically through small incisions, the proximal ureter is divided, stones removed, and ureter reattached anteriorly to the blocking crossing artery.  We discussed the need for ureteral stent for up to 6 weeks that can cause symptoms of urgency, frequency, dysuria, and flank pain, as well as the risks of bleeding, infection, urine leak, postoperative pain.  Using shared decision-making with the patient and her mother, they opt to schedule left robotic pyeloplasty and stone removal.  Schedule left robotic pyeloplasty with stone removal  Billey Co, MD  [facility] [address], [place], [place] [address] [phone]

## 2021-07-03 ENCOUNTER — Telehealth: Payer: Self-pay

## 2021-07-03 NOTE — Progress Notes (Signed)
[facility] Posting Form   Surgery Date/Time: Date: 07/27/2021  Surgeon: Dr. Legrand Rams, MD and Dr. 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

## 2021-07-03 NOTE — Telephone Encounter (Signed)
I spoke with Vanessa Hester. We have discussed possible surgery dates and Monday March 13th, 2023 was agreed upon by all parties. Patient given information about surgery date, what to expect pre-operatively and post operatively.   We discussed that a Pre-Admission Testing office will be calling to set up the pre-op visit that will take place prior to surgery, and that these appointments are typically done over the phone with a Pre-Admissions RN.   Informed patient that our office will communicate any additional care to be provided after surgery. Patients questions or concerns were discussed during our call. Advised to call our office should there be any additional information, questions or concerns that arise. Patient verbalized understanding.

## 2021-07-16 ENCOUNTER — Other Ambulatory Visit: Payer: 59

## 2021-07-16 ENCOUNTER — Other Ambulatory Visit: Payer: Self-pay

## 2021-07-16 DIAGNOSIS — N135 Crossing vessel and stricture of ureter without hydronephrosis: Secondary | ICD-10-CM

## 2021-07-16 DIAGNOSIS — N200 Calculus of kidney: Secondary | ICD-10-CM

## 2021-07-17 ENCOUNTER — Encounter: Payer: Self-pay | Admitting: Emergency Medicine

## 2021-07-17 ENCOUNTER — Emergency Department
Admission: EM | Admit: 2021-07-17 | Discharge: 2021-07-17 | Disposition: A | Discharge status: Home / Self Care | Payer: Self-pay | Attending: Student in an Organized Health Care Education/Training Program | Admitting: Student in an Organized Health Care Education/Training Program

## 2021-07-17 ENCOUNTER — Telehealth: Payer: Self-pay | Admitting: *Deleted

## 2021-07-17 DIAGNOSIS — Z5321 Procedure and treatment not carried out due to patient leaving prior to being seen by health care provider: Secondary | ICD-10-CM | POA: Insufficient documentation

## 2021-07-17 DIAGNOSIS — R1032 Left lower quadrant pain: Secondary | ICD-10-CM | POA: Insufficient documentation

## 2021-07-17 LAB — URINALYSIS, COMPLETE
Bilirubin, UA: NEGATIVE
Glucose, UA: NEGATIVE
Ketones, UA: NEGATIVE
Nitrite, UA: POSITIVE — AB
Specific Gravity, UA: >1.03 — ABNORMAL HIGH (ref 1.005–1.030)
Urobilinogen, Ur: 0.2 mg/dL (ref 0.2–1.0)
pH, UA: 6 (ref 5.0–7.5)

## 2021-07-17 LAB — BASIC METABOLIC PANEL
Anion gap: 8 (ref 5–15)
BUN: 15 mg/dL (ref 6–20)
CO2: 19 mmol/L — ABNORMAL LOW (ref 22–32)
Calcium: 8.2 mg/dL — ABNORMAL LOW (ref 8.9–10.3)
Chloride: 109 mmol/L (ref 98–111)
Creatinine, Ser: 0.74 mg/dL (ref 0.44–1.00)
GFR, Estimated: >60 mL/min (ref >60)
Glucose, Bld: 136 mg/dL — ABNORMAL HIGH (ref 70–99)
Potassium: 3.6 mmol/L (ref 3.5–5.1)
Sodium: 136 mmol/L (ref 135–145)

## 2021-07-17 LAB — CBC
HCT: 39.3 % (ref 36.0–46.0)
Hemoglobin: 12.5 g/dL (ref 12.0–15.0)
MCH: 27.4 pg (ref 26.0–34.0)
MCHC: 31.8 g/dL (ref 30.0–36.0)
MCV: 86.2 fL (ref 80.0–100.0)
Platelets: 229 10*3/uL (ref 150–400)
RBC: 4.56 MIL/uL (ref 3.87–5.11)
RDW: 13.9 % (ref 11.5–15.5)
WBC: 13.6 10*3/uL — ABNORMAL HIGH (ref 4.0–10.5)
nRBC: 0 % (ref 0.0–0.2)

## 2021-07-17 LAB — MICROSCOPIC EXAMINATION: WBC, UA: >30 /hpf — ABNORMAL HIGH (ref 0–5)

## 2021-07-17 MED ORDER — ONDANSETRON HCL 4 MG/2ML IJ SOLN
4.0000 mg | Freq: Once | INTRAMUSCULAR | Status: DC
  Filled 2021-07-17: qty 2

## 2021-07-17 MED ORDER — SODIUM CHLORIDE 0.9 % IV SOLN: INTRAVENOUS | Status: DC

## 2021-07-17 MED ORDER — HYDROMORPHONE HCL 1 MG/ML IJ SOLN
1.0000 mg | INTRAMUSCULAR | Status: DC | PRN
  Filled 2021-07-17: qty 1

## 2021-07-17 MED ORDER — SODIUM CHLORIDE 0.9 % IV BOLUS: 1000.0000 mL | Freq: Once | INTRAVENOUS | Status: DC

## 2021-07-17 NOTE — Telephone Encounter (Signed)
Patient called Triage line to report excruciating pain left flank on the way to the ER. Denies fevers, or any other urinary issues. Scheduled for left robotic left robotic pyeloplasty 07/27/21.  ?  ?

## 2021-07-17 NOTE — ED Notes (Addendum)
20 IV started to [facility].  Called away from patient due to a more critical patient need.  Returned to room and family upset that patietn was not being treated like an emergency.  I apologized and stated that I had been pulled away for an emergency. Family upset that patient wasn't being treated like an emergency with her pain.  Stated I had pain medication in my pocket and ready to give her, declined.  Patient stood from wheelchair and stated that she did not want anything from this hosptial.  IV D/C'd.  Site clean and dry.  Catheter intact..  Ambulated independently out of ED. ?

## 2021-07-17 NOTE — ED Triage Notes (Signed)
Has known left kidney stones, surgery planned for 3/13. ARrives today with c/o left flank pain.  STates pain started last night in back and has worsened. ?

## 2021-07-20 LAB — CULTURE, URINE COMPREHENSIVE

## 2021-07-24 ENCOUNTER — Inpatient Hospital Stay: Admission: RE | Admit: 2021-07-24 | Payer: Self-pay | Source: Ambulatory Visit

## 2021-08-11 ENCOUNTER — Other Ambulatory Visit: Payer: Self-pay | Admitting: Family Medicine

## 2021-08-11 ENCOUNTER — Other Ambulatory Visit: Payer: Self-pay

## 2021-08-11 ENCOUNTER — Other Ambulatory Visit: Payer: BC Managed Care – PPO

## 2021-08-11 DIAGNOSIS — N135 Crossing vessel and stricture of ureter without hydronephrosis: Secondary | ICD-10-CM

## 2021-08-11 MED ORDER — METRONIDAZOLE 500 MG PO TABS: ORAL_TABLET | ORAL | 0 refills | Status: DC

## 2021-08-11 NOTE — Telephone Encounter (Signed)
Patient informed that her UA has come back with Trichomonas. Per Dr. Richardo Hanks she is to take Metronidazole 2g. 1 for her and 1 for her partner. Patient voiced understanding.  ?

## 2021-08-12 LAB — URINALYSIS, COMPLETE
Bilirubin, UA: NEGATIVE
Glucose, UA: NEGATIVE
Ketones, UA: NEGATIVE
Nitrite, UA: POSITIVE — AB
Specific Gravity, UA: 1.02 (ref 1.005–1.030)
Urobilinogen, Ur: 0.2 mg/dL (ref 0.2–1.0)
pH, UA: 6.5 (ref 5.0–7.5)

## 2021-08-12 LAB — MICROSCOPIC EXAMINATION

## 2021-08-15 LAB — CULTURE, URINE COMPREHENSIVE

## 2021-08-17 ENCOUNTER — Other Ambulatory Visit: Payer: Self-pay

## 2021-08-17 ENCOUNTER — Telehealth: Payer: Self-pay

## 2021-08-17 ENCOUNTER — Other Ambulatory Visit: Payer: Self-pay | Admitting: Urology

## 2021-08-17 ENCOUNTER — Encounter
Admission: RE | Admit: 2021-08-17 | Discharge: 2021-08-17 | Disposition: A | Discharge status: Home / Self Care | Payer: Self-pay | Source: Ambulatory Visit | Attending: Urology | Admitting: Urology

## 2021-08-17 VITALS — Ht 65.0 in | Wt 139.0 lb

## 2021-08-17 DIAGNOSIS — A599 Trichomoniasis, unspecified: Secondary | ICD-10-CM

## 2021-08-17 DIAGNOSIS — R825 Elevated urine levels of drugs, medicaments and biological substances: Secondary | ICD-10-CM

## 2021-08-17 HISTORY — DX: Anemia, unspecified: D64.9

## 2021-08-17 HISTORY — DX: Personal history of urinary calculi: Z87.442

## 2021-08-17 MED ORDER — METRONIDAZOLE 500 MG PO TABS: 500.0000 mg | ORAL_TABLET | Freq: Four times a day (QID) | ORAL | 0 refills | Status: AC

## 2021-08-17 NOTE — Patient Instructions (Signed)
Your procedure is scheduled on: 08/24/21 Report to [facility]. To find out your arrival time please call [phone] between 1PM - 3PM on 08/21/21.  Remember: Instructions that are not followed completely may result in serious medical risk, up to and including death, or upon the discretion of your surgeon and anesthesiologist your surgery may need to be rescheduled.     _X__ 1. Do not eat food or drink any liquids after midnight the night before your procedure.                 No gum chewing or hard candies.   __X__2.  On the morning of surgery brush your teeth with toothpaste and water, you                 may rinse your mouth with mouthwash if you wish.  Do not swallow any              toothpaste of mouthwash.     _X__ 3.  No Alcohol for 24 hours before or after surgery.   _X__ 4.  Do Not Smoke or use e-cigarettes For 24 Hours Prior to Your Surgery.                 Do not use any chewable tobacco products for at least 6 hours prior to                 surgery.  ____  5.  Bring all medications with you on the day of surgery if instructed.   __X__  6.  Notify your doctor if there is any change in your medical condition      (cold, fever, infections).     Do not wear jewelry, make-up, hairpins, clips or nail polish. Do not wear lotions, powders, or perfumes.  Do not shave body hair 48 hours prior to surgery. Men may shave face and neck. Do not bring valuables to the hospital.    [facility] is not responsible for any belongings or valuables.  Contacts, dentures/partials or body piercings may not be worn into surgery. Bring a case for your contacts, glasses or hearing aids, a denture cup will be supplied. Leave your suitcase in the car. After surgery it may be brought to your room. For patients admitted to the hospital, discharge time is determined by your treatment team.   Patients discharged the day of surgery will not be allowed  to drive home.   Please read over the following fact sheets that you were given:     __X__ Take these medicines the morning of surgery with A SIP OF WATER:    1. escitalopram (LEXAPRO) 10 MG tablet  2. oxyCODONE (ROXICODONE) 5 MG immediate release tablet if needed  3.   4.  5.  6.  ____ Fleet Enema (as directed)   ____ Use CHG Soap/SAGE wipes as directed  ____ Use inhalers on the day of surgery  ____ Stop metformin/Janumet/Farxiga 2 days prior to surgery    ____ Take 1/2 of usual insulin dose the night before surgery. No insulin the morning          of surgery.   ____ Stop Blood Thinners Coumadin/Plavix/Xarelto/Pleta/Pradaxa/Eliquis/Effient/Aspirin  on   Or contact your Surgeon, Cardiologist or Medical Doctor regarding  ability to stop your blood thinners  __X__ Stop Anti-inflammatories 7 days before surgery such as Advil, Ibuprofen, Motrin,  BC or Goodies Powder, Naprosyn, Naproxen, Aleve, Aspirin  __X__ Stop all herbal supplements, fish oil or vitamin E until after surgery.    ____ Bring C-Pap to the hospital.    Begin showering today with Dial "Gold" bar antibacterial soap. Use this every day including the day of your procedure.  Mellissa from [facility] office will contact you regarding stopping the Toradol of whether you may continue to take it. If you can continue to take it, do not take the day of your procedure. If you do not hear from her by Wednesday 08/19/21 please call their office at [phone].

## 2021-08-17 NOTE — Telephone Encounter (Signed)
Pt LM on triage line stating that she was able to pick up her partner's medication but the pharmacy did not have hers. I called [facility] and the pharmacist stated that they either did not receive second RX or it was possibly cancelled out in error. They need a new script sent in for the female dose.  ?

## 2021-08-18 ENCOUNTER — Telehealth: Payer: Self-pay

## 2021-08-18 NOTE — Telephone Encounter (Signed)
I called and left a detailed message on patient voicemail to stop Toradol 4 days before surgery. Also left my number if patient has any questions.  ?

## 2021-08-20 ENCOUNTER — Other Ambulatory Visit: Payer: Self-pay

## 2021-08-20 ENCOUNTER — Encounter
Admission: RE | Admit: 2021-08-20 | Discharge: 2021-08-20 | Disposition: A | Discharge status: Home / Self Care | Payer: BC Managed Care – PPO | Source: Ambulatory Visit | Attending: Urology | Admitting: Urology

## 2021-08-20 DIAGNOSIS — Z01812 Encounter for preprocedural laboratory examination: Secondary | ICD-10-CM | POA: Diagnosis present

## 2021-08-20 LAB — URINE DRUG SCREEN, QUALITATIVE (ARMC ONLY)
Amphetamines, Ur Screen: NOT DETECTED
Barbiturates, Ur Screen: NOT DETECTED
Benzodiazepine, Ur Scrn: NOT DETECTED
Cannabinoid 50 Ng, Ur ~~LOC~~: NOT DETECTED
Cocaine Metabolite,Ur ~~LOC~~: NOT DETECTED
MDMA (Ecstasy)Ur Screen: NOT DETECTED
Methadone Scn, Ur: NOT DETECTED
Opiate, Ur Screen: NOT DETECTED
Phencyclidine (PCP) Ur S: NOT DETECTED
Tricyclic, Ur Screen: NOT DETECTED

## 2021-08-24 ENCOUNTER — Ambulatory Visit: Payer: BC Managed Care – PPO | Admitting: Urgent Care

## 2021-08-24 ENCOUNTER — Other Ambulatory Visit: Payer: Self-pay

## 2021-08-24 ENCOUNTER — Encounter
Admission: RE | Disposition: A | Discharge status: Home / Self Care | Payer: Self-pay | Source: Home / Self Care | Attending: Urology

## 2021-08-24 ENCOUNTER — Observation Stay
Admission: RE | Admit: 2021-08-24 | Discharge: 2021-08-25 | Disposition: A | Discharge status: Home / Self Care | Payer: BC Managed Care – PPO | Attending: Urology | Admitting: Urology

## 2021-08-24 ENCOUNTER — Encounter: Payer: Self-pay | Admitting: Urology

## 2021-08-24 DIAGNOSIS — N130 Hydronephrosis with ureteropelvic junction obstruction: Secondary | ICD-10-CM | POA: Diagnosis present

## 2021-08-24 DIAGNOSIS — R825 Elevated urine levels of drugs, medicaments and biological substances: Secondary | ICD-10-CM

## 2021-08-24 DIAGNOSIS — N200 Calculus of kidney: Secondary | ICD-10-CM | POA: Diagnosis not present

## 2021-08-24 DIAGNOSIS — Z87891 Personal history of nicotine dependence: Secondary | ICD-10-CM | POA: Diagnosis not present

## 2021-08-24 DIAGNOSIS — N135 Crossing vessel and stricture of ureter without hydronephrosis: Secondary | ICD-10-CM

## 2021-08-24 HISTORY — PX: ROBOT ASSISTED PYELOPLASTY: SHX5143

## 2021-08-24 LAB — URINE DRUG SCREEN, QUALITATIVE (ARMC ONLY)
Amphetamines, Ur Screen: NOT DETECTED
Barbiturates, Ur Screen: NOT DETECTED
Benzodiazepine, Ur Scrn: NOT DETECTED
Cannabinoid 50 Ng, Ur ~~LOC~~: POSITIVE — AB
Cocaine Metabolite,Ur ~~LOC~~: NOT DETECTED
MDMA (Ecstasy)Ur Screen: NOT DETECTED
Methadone Scn, Ur: NOT DETECTED
Opiate, Ur Screen: NOT DETECTED
Phencyclidine (PCP) Ur S: NOT DETECTED
Tricyclic, Ur Screen: NOT DETECTED

## 2021-08-24 LAB — POCT PREGNANCY, URINE: Preg Test, Ur: NEGATIVE

## 2021-08-24 SURGERY — XI ROBOTIC ASSISTED PYELOPLASTY WITH STENT PLACEMENT
Anesthesia: General | Laterality: Left

## 2021-08-24 MED ORDER — FENTANYL CITRATE (PF) 100 MCG/2ML IJ SOLN
INTRAMUSCULAR | Status: AC
  Filled 2021-08-24: qty 2

## 2021-08-24 MED ORDER — BUPIVACAINE HCL 0.5 % IJ SOLN
INTRAMUSCULAR | Status: DC | PRN
  Administered 2021-08-24: 25 mL

## 2021-08-24 MED ORDER — BUPIVACAINE HCL (PF) 0.5 % IJ SOLN
INTRAMUSCULAR | Status: AC
Start: 2021-08-24 — End: ?
  Filled 2021-08-24: qty 30

## 2021-08-24 MED ORDER — TRAZODONE HCL 50 MG PO TABS
50.0000 mg | ORAL_TABLET | Freq: Every day | ORAL | Status: DC
  Administered 2021-08-24: 50 mg via ORAL
  Filled 2021-08-24: qty 1

## 2021-08-24 MED ORDER — OXYCODONE HCL 5 MG/5ML PO SOLN: 5.0000 mg | Freq: Once | ORAL | Status: DC | PRN

## 2021-08-24 MED ORDER — ROCURONIUM BROMIDE 10 MG/ML (PF) SYRINGE
PREFILLED_SYRINGE | INTRAVENOUS | Status: AC
  Filled 2021-08-24: qty 20

## 2021-08-24 MED ORDER — ONDANSETRON HCL 4 MG/2ML IJ SOLN
4.0000 mg | INTRAMUSCULAR | Status: DC | PRN
  Administered 2021-08-24 – 2021-08-25 (×3): 4 mg via INTRAVENOUS
  Filled 2021-08-24 (×3): qty 2

## 2021-08-24 MED ORDER — CHLORHEXIDINE GLUCONATE 0.12 % MT SOLN: 15.0000 mL | Freq: Once | OROMUCOSAL | Status: AC

## 2021-08-24 MED ORDER — OXYCODONE HCL 5 MG PO TABS
5.0000 mg | ORAL_TABLET | ORAL | Status: DC | PRN
  Administered 2021-08-25 (×2): 5 mg via ORAL
  Filled 2021-08-24 (×2): qty 1

## 2021-08-24 MED ORDER — DEXAMETHASONE SODIUM PHOSPHATE 10 MG/ML IJ SOLN
INTRAMUSCULAR | Status: AC
Start: 2021-08-24 — End: ?
  Filled 2021-08-24: qty 1

## 2021-08-24 MED ORDER — ESCITALOPRAM OXALATE 10 MG PO TABS
10.0000 mg | ORAL_TABLET | Freq: Every day | ORAL | Status: DC
  Administered 2021-08-24 – 2021-08-25 (×2): 10 mg via ORAL
  Filled 2021-08-24 (×2): qty 1

## 2021-08-24 MED ORDER — HEPARIN SODIUM (PORCINE) 5000 UNIT/ML IJ SOLN
5000.0000 [IU] | Freq: Three times a day (TID) | INTRAMUSCULAR | Status: DC
  Administered 2021-08-25 (×2): 5000 [IU] via SUBCUTANEOUS
  Filled 2021-08-24 (×2): qty 1

## 2021-08-24 MED ORDER — PROMETHAZINE HCL 25 MG/ML IJ SOLN
INTRAMUSCULAR | Status: AC
  Filled 2021-08-24: qty 1

## 2021-08-24 MED ORDER — LACTATED RINGERS IV SOLN: INTRAVENOUS | Status: DC

## 2021-08-24 MED ORDER — ACETAMINOPHEN 10 MG/ML IV SOLN
INTRAVENOUS | Status: AC
  Filled 2021-08-24: qty 100

## 2021-08-24 MED ORDER — FENTANYL CITRATE (PF) 100 MCG/2ML IJ SOLN
25.0000 ug | INTRAMUSCULAR | Status: DC | PRN
  Administered 2021-08-24 (×2): 25 ug via INTRAVENOUS

## 2021-08-24 MED ORDER — PROPOFOL 10 MG/ML IV BOLUS
INTRAVENOUS | Status: DC | PRN
  Administered 2021-08-24: 30 mg via INTRAVENOUS
  Administered 2021-08-24: 200 mg via INTRAVENOUS

## 2021-08-24 MED ORDER — PROPOFOL 500 MG/50ML IV EMUL
INTRAVENOUS | Status: AC
  Filled 2021-08-24: qty 50

## 2021-08-24 MED ORDER — PROPOFOL 1000 MG/100ML IV EMUL
INTRAVENOUS | Status: AC
  Filled 2021-08-24: qty 200

## 2021-08-24 MED ORDER — ORAL CARE MOUTH RINSE: 15.0000 mL | Freq: Once | OROMUCOSAL | Status: AC

## 2021-08-24 MED ORDER — ACETAMINOPHEN 10 MG/ML IV SOLN
INTRAVENOUS | Status: DC | PRN
  Administered 2021-08-24: 1000 mg via INTRAVENOUS

## 2021-08-24 MED ORDER — SODIUM CHLORIDE 0.45 % IV SOLN: INTRAVENOUS | Status: DC

## 2021-08-24 MED ORDER — CEFAZOLIN SODIUM 1 G IJ SOLR
INTRAMUSCULAR | Status: AC
  Filled 2021-08-24: qty 20

## 2021-08-24 MED ORDER — PROPOFOL 10 MG/ML IV BOLUS
INTRAVENOUS | Status: AC
  Filled 2021-08-24: qty 20

## 2021-08-24 MED ORDER — DEXAMETHASONE SODIUM PHOSPHATE 10 MG/ML IJ SOLN
INTRAMUSCULAR | Status: DC | PRN
  Administered 2021-08-24: 10 mg via INTRAVENOUS

## 2021-08-24 MED ORDER — HEPARIN SODIUM (PORCINE) 5000 UNIT/ML IJ SOLN
INTRAMUSCULAR | Status: DC | PRN
  Administered 2021-08-24: 5000 [IU] via SUBCUTANEOUS

## 2021-08-24 MED ORDER — FENTANYL CITRATE (PF) 100 MCG/2ML IJ SOLN
INTRAMUSCULAR | Status: DC | PRN
  Administered 2021-08-24 (×5): 50 ug via INTRAVENOUS

## 2021-08-24 MED ORDER — HEPARIN SODIUM (PORCINE) 5000 UNIT/ML IJ SOLN
INTRAMUSCULAR | Status: AC
  Filled 2021-08-24: qty 1

## 2021-08-24 MED ORDER — DOCUSATE SODIUM 100 MG PO CAPS
100.0000 mg | ORAL_CAPSULE | Freq: Two times a day (BID) | ORAL | Status: DC
  Administered 2021-08-24 – 2021-08-25 (×2): 100 mg via ORAL
  Filled 2021-08-24 (×2): qty 1

## 2021-08-24 MED ORDER — CHLORHEXIDINE GLUCONATE 0.12 % MT SOLN
OROMUCOSAL | Status: AC
Start: 2021-08-24 — End: 2021-08-24
  Administered 2021-08-24: 15 mL via OROMUCOSAL
  Filled 2021-08-24: qty 15

## 2021-08-24 MED ORDER — BUPIVACAINE LIPOSOME 1.3 % IJ SUSP
INTRAMUSCULAR | Status: AC
  Filled 2021-08-24: qty 20

## 2021-08-24 MED ORDER — PROMETHAZINE HCL 25 MG/ML IJ SOLN
INTRAMUSCULAR | Status: DC | PRN
  Administered 2021-08-24: 12.5 mg via INTRAVENOUS

## 2021-08-24 MED ORDER — SULFAMETHOXAZOLE-TRIMETHOPRIM 800-160 MG PO TABS
1.0000 | ORAL_TABLET | Freq: Every day | ORAL | Status: DC
  Administered 2021-08-25: 1 via ORAL
  Filled 2021-08-24: qty 1

## 2021-08-24 MED ORDER — ONDANSETRON HCL 4 MG/2ML IJ SOLN
INTRAMUSCULAR | Status: DC | PRN
  Administered 2021-08-24: 4 mg via INTRAVENOUS

## 2021-08-24 MED ORDER — ONDANSETRON HCL 4 MG/2ML IJ SOLN: 4.0000 mg | Freq: Once | INTRAMUSCULAR | Status: DC | PRN

## 2021-08-24 MED ORDER — ROCURONIUM BROMIDE 10 MG/ML (PF) SYRINGE
PREFILLED_SYRINGE | INTRAVENOUS | Status: AC
  Filled 2021-08-24: qty 10

## 2021-08-24 MED ORDER — SUCCINYLCHOLINE CHLORIDE 200 MG/10ML IV SOSY
PREFILLED_SYRINGE | INTRAVENOUS | Status: AC
  Filled 2021-08-24: qty 10

## 2021-08-24 MED ORDER — MIDAZOLAM HCL 2 MG/2ML IJ SOLN
INTRAMUSCULAR | Status: AC
  Filled 2021-08-24: qty 2

## 2021-08-24 MED ORDER — LIDOCAINE HCL (PF) 2 % IJ SOLN
INTRAMUSCULAR | Status: AC
  Filled 2021-08-24: qty 5

## 2021-08-24 MED ORDER — KETAMINE HCL 50 MG/5ML IJ SOSY
PREFILLED_SYRINGE | INTRAMUSCULAR | Status: AC
  Filled 2021-08-24: qty 5

## 2021-08-24 MED ORDER — OXYCODONE HCL 5 MG PO TABS: 5.0000 mg | ORAL_TABLET | Freq: Once | ORAL | Status: DC | PRN

## 2021-08-24 MED ORDER — OXYBUTYNIN CHLORIDE 5 MG PO TABS: 5.0000 mg | ORAL_TABLET | Freq: Three times a day (TID) | ORAL | Status: DC | PRN

## 2021-08-24 MED ORDER — BUPIVACAINE-EPINEPHRINE (PF) 0.25% -1:200000 IJ SOLN
INTRAMUSCULAR | Status: AC
  Filled 2021-08-24: qty 30

## 2021-08-24 MED ORDER — FAMOTIDINE 20 MG PO TABS
ORAL_TABLET | ORAL | Status: AC
  Administered 2021-08-24: 20 mg via ORAL
  Filled 2021-08-24: qty 1

## 2021-08-24 MED ORDER — ONDANSETRON HCL 4 MG PO TABS: 4.0000 mg | ORAL_TABLET | Freq: Three times a day (TID) | ORAL | Status: DC | PRN

## 2021-08-24 MED ORDER — ACETAMINOPHEN 325 MG PO TABS: 650.0000 mg | ORAL_TABLET | ORAL | Status: DC | PRN

## 2021-08-24 MED ORDER — CEFAZOLIN SODIUM-DEXTROSE 2-4 GM/100ML-% IV SOLN
INTRAVENOUS | Status: AC
  Filled 2021-08-24: qty 100

## 2021-08-24 MED ORDER — HEMOSTATIC AGENTS (NO CHARGE) OPTIME
TOPICAL | Status: DC | PRN
  Administered 2021-08-24: 1 via TOPICAL

## 2021-08-24 MED ORDER — FENTANYL CITRATE (PF) 100 MCG/2ML IJ SOLN
INTRAMUSCULAR | Status: AC
  Administered 2021-08-24: 25 ug via INTRAVENOUS
  Filled 2021-08-24: qty 2

## 2021-08-24 MED ORDER — DEXMEDETOMIDINE HCL IN NACL 80 MCG/20ML IV SOLN
INTRAVENOUS | Status: AC
  Filled 2021-08-24: qty 20

## 2021-08-24 MED ORDER — FAMOTIDINE 20 MG PO TABS: 20.0000 mg | ORAL_TABLET | Freq: Once | ORAL | Status: AC

## 2021-08-24 MED ORDER — KETAMINE HCL 10 MG/ML IJ SOLN
INTRAMUSCULAR | Status: DC | PRN
  Administered 2021-08-24: 30 mg via INTRAVENOUS

## 2021-08-24 MED ORDER — CEFAZOLIN SODIUM-DEXTROSE 2-4 GM/100ML-% IV SOLN
2.0000 g | INTRAVENOUS | Status: AC
  Administered 2021-08-24 (×2): 2 g via INTRAVENOUS

## 2021-08-24 MED ORDER — LIDOCAINE HCL (CARDIAC) PF 100 MG/5ML IV SOSY
PREFILLED_SYRINGE | INTRAVENOUS | Status: DC | PRN
  Administered 2021-08-24: 100 mg via INTRAVENOUS

## 2021-08-24 MED ORDER — ACETAMINOPHEN 325 MG PO TABS: 650.0000 mg | ORAL_TABLET | Freq: Four times a day (QID) | ORAL | Status: DC | PRN

## 2021-08-24 MED ORDER — ACETAMINOPHEN 10 MG/ML IV SOLN: 1000.0000 mg | Freq: Once | INTRAVENOUS | Status: DC | PRN

## 2021-08-24 MED ORDER — MIDAZOLAM HCL 2 MG/2ML IJ SOLN
INTRAMUSCULAR | Status: DC | PRN
  Administered 2021-08-24: 2 mg via INTRAVENOUS

## 2021-08-24 MED ORDER — ONDANSETRON HCL 4 MG/2ML IJ SOLN
INTRAMUSCULAR | Status: AC
  Filled 2021-08-24: qty 2

## 2021-08-24 MED ORDER — HYDROMORPHONE HCL 1 MG/ML IJ SOLN
0.5000 mg | INTRAMUSCULAR | Status: DC | PRN
  Administered 2021-08-24 (×2): 1 mg via INTRAVENOUS
  Filled 2021-08-24 (×2): qty 1

## 2021-08-24 MED ORDER — SUGAMMADEX SODIUM 200 MG/2ML IV SOLN
INTRAVENOUS | Status: DC | PRN
Start: 2021-08-24 — End: 2021-08-24
  Administered 2021-08-24: 200 mg via INTRAVENOUS

## 2021-08-24 MED ORDER — 0.9 % SODIUM CHLORIDE (POUR BTL) OPTIME
TOPICAL | Status: DC | PRN
  Administered 2021-08-24: 1000 mL

## 2021-08-24 MED ORDER — PROPOFOL 500 MG/50ML IV EMUL
INTRAVENOUS | Status: DC | PRN
  Administered 2021-08-24: 125 ug/kg/min via INTRAVENOUS

## 2021-08-24 MED ORDER — ROCURONIUM BROMIDE 100 MG/10ML IV SOLN
INTRAVENOUS | Status: DC | PRN
Start: 2021-08-24 — End: 2021-08-24
  Administered 2021-08-24: 60 mg via INTRAVENOUS
  Administered 2021-08-24: 10 mg via INTRAVENOUS
  Administered 2021-08-24 (×7): 20 mg via INTRAVENOUS
  Administered 2021-08-24: 10 mg via INTRAVENOUS

## 2021-08-24 MED ORDER — NAPHAZOLINE-GLYCERIN 0.012-0.2 % OP SOLN
1.0000 [drp] | Freq: Four times a day (QID) | OPHTHALMIC | Status: DC | PRN
  Administered 2021-08-24 – 2021-08-25 (×2): 2 [drp] via OPHTHALMIC
  Filled 2021-08-24 (×2): qty 15

## 2021-08-24 MED ORDER — DEXMEDETOMIDINE (PRECEDEX) IN NS 20 MCG/5ML (4 MCG/ML) IV SYRINGE
PREFILLED_SYRINGE | INTRAVENOUS | Status: DC | PRN
  Administered 2021-08-24: 8 ug via INTRAVENOUS
  Administered 2021-08-24: 4 ug via INTRAVENOUS
  Administered 2021-08-24: 8 ug via INTRAVENOUS

## 2021-08-24 SURGICAL SUPPLY — 94 items
ADH SKN CLS APL DERMABOND .7 (GAUZE/BANDAGES/DRESSINGS) ×1
AGENT HMST KT MTR STRL THRMB (HEMOSTASIS) ×1
APL ESCP 34 STRL LF DISP (HEMOSTASIS) ×1
APL PRP STRL LF DISP 70% ISPRP (MISCELLANEOUS)
APPLICATOR SURGIFLO ENDO (HEMOSTASIS) ×2 IMPLANT
BAG RETRIEVAL 10 (BASKET) ×1
BLADE CLIPPER SURG (BLADE) ×2 IMPLANT
BLADE SURG 15 STRL LF DISP TIS (BLADE) ×1 IMPLANT
BLADE SURG 15 STRL SS (BLADE) ×2
BULB RESERV EVAC DRAIN JP 100C (MISCELLANEOUS) ×3 IMPLANT
CATH URETL OPEN 5X70 (CATHETERS) ×1 IMPLANT
CHLORAPREP W/TINT 26 (MISCELLANEOUS) ×1 IMPLANT
CLIP LIGATING HEM O LOK PURPLE (MISCELLANEOUS) ×1 IMPLANT
CLIP VESOLOCK MED LG 6/CT (CLIP) IMPLANT
CORD BIP STRL DISP 12FT (MISCELLANEOUS) ×2 IMPLANT
COVER TIP SHEARS 8 DVNC (MISCELLANEOUS) ×2 IMPLANT
COVER TIP SHEARS 8MM DA VINCI (MISCELLANEOUS) ×1
DERMABOND ADVANCED (GAUZE/BANDAGES/DRESSINGS) ×1
DERMABOND ADVANCED .7 DNX12 (GAUZE/BANDAGES/DRESSINGS) ×1 IMPLANT
DRAIN CHANNEL JP 19F (MISCELLANEOUS) ×2 IMPLANT
DRAPE 3/4 80X56 (DRAPES) ×2 IMPLANT
DRAPE COLUMN DVNC XI (DISPOSABLE) ×1 IMPLANT
DRAPE DA VINCI XI COLUMN (DISPOSABLE) ×1
DRAPE LAPAROSCOPIC ABDOMINAL (DRAPES) ×2 IMPLANT
DRAPE SURG 17X11 SM STRL (DRAPES) ×8 IMPLANT
ELECT REM PT RETURN 9FT ADLT (ELECTROSURGICAL) ×2
ELECTRODE REM PT RTRN 9FT ADLT (ELECTROSURGICAL) ×1 IMPLANT
GLIDEWIRE STIFF .35X180X3 HYDR (WIRE) ×1 IMPLANT
GLOVE SURG ENC MOIS LTX SZ6.5 (GLOVE) ×1 IMPLANT
GLOVE SURG UNDER POLY LF SZ6.5 (GLOVE) ×2 IMPLANT
GLOVE SURG UNDER POLY LF SZ7.5 (GLOVE) ×4 IMPLANT
GOWN STRL REUS W/ TWL LRG LVL3 (GOWN DISPOSABLE) ×3 IMPLANT
GOWN STRL REUS W/ TWL XL LVL3 (GOWN DISPOSABLE) ×2 IMPLANT
GOWN STRL REUS W/TWL LRG LVL3 (GOWN DISPOSABLE) ×6
GOWN STRL REUS W/TWL XL LVL3 (GOWN DISPOSABLE) ×4
GRASPER SUT TROCAR 14GX15 (MISCELLANEOUS) ×2 IMPLANT
GUIDEWIRE STR DUAL SENSOR (WIRE) ×2 IMPLANT
HANDLE YANKAUER SUCT BULB TIP (MISCELLANEOUS) ×2 IMPLANT
HEMOSTAT SURGICEL 2X3 (HEMOSTASIS) ×1 IMPLANT
IRRIGATION STRYKERFLOW (MISCELLANEOUS) ×1 IMPLANT
IRRIGATOR STRYKERFLOW (MISCELLANEOUS) ×2
IV NS 1000ML (IV SOLUTION) ×2
IV NS 1000ML BAXH (IV SOLUTION) ×1 IMPLANT
KIT PINK PAD W/HEAD ARE REST (MISCELLANEOUS)
KIT PINK PAD W/HEAD ARM REST (MISCELLANEOUS) IMPLANT
LOOP RED MAXI  1X406MM (MISCELLANEOUS) ×1
LOOP VESSEL MAXI 1X406 RED (MISCELLANEOUS) ×1 IMPLANT
LOOP VESSEL MINI 0.8X406 BLUE (MISCELLANEOUS) ×1 IMPLANT
LOOPS BLUE MINI 0.8X406MM (MISCELLANEOUS) ×1
MANIFOLD NEPTUNE II (INSTRUMENTS) ×2 IMPLANT
NDL HYPO 25X1 1.5 SAFETY (NEEDLE) ×1 IMPLANT
NDL INSUFF 14G 150MM VS150000 (NEEDLE) ×1 IMPLANT
NDL INSUFFLATION 14GA 120MM (NEEDLE) ×1 IMPLANT
NEEDLE HYPO 22GX1.5 SAFETY (NEEDLE) ×2 IMPLANT
NEEDLE HYPO 25X1 1.5 SAFETY (NEEDLE) ×2 IMPLANT
NEEDLE INSUFFLATION 14GA 120MM (NEEDLE) IMPLANT
NEEDLE VERESS 14GA 120MM (NEEDLE) ×1 IMPLANT
NS IRRIG 1000ML POUR BTL (IV SOLUTION) ×2 IMPLANT
NS IRRIG 500ML POUR BTL (IV SOLUTION) ×2 IMPLANT
OBTURATOR OPTICAL STANDARD 8MM (TROCAR) ×1
OBTURATOR OPTICAL STND 8 DVNC (TROCAR) ×1
OBTURATOR OPTICALSTD 8 DVNC (TROCAR) IMPLANT
PACK LAP CHOLECYSTECTOMY (MISCELLANEOUS) ×2 IMPLANT
PENCIL ELECTRO HAND CTR (MISCELLANEOUS) ×2 IMPLANT
SEAL CANN UNIV 5-8 DVNC XI (MISCELLANEOUS) IMPLANT
SEAL XI 5MM-8MM UNIVERSAL (MISCELLANEOUS) ×4
SET CYSTO W/LG BORE CLAMP LF (SET/KITS/TRAYS/PACK) ×2 IMPLANT
SET TUBE SMOKE EVAC HIGH FLOW (TUBING) ×2 IMPLANT
SOLUTION ELECTROLUBE (MISCELLANEOUS) ×2 IMPLANT
SPONGE T-LAP 18X18 ~~LOC~~+RFID (SPONGE) ×2 IMPLANT
STAPLER SKIN PROX 35W (STAPLE) ×2 IMPLANT
STENT URET 6FRX24 CONTOUR (STENTS) ×1 IMPLANT
STENT URET 6FRX26 CONTOUR (STENTS) ×1 IMPLANT
STENT URET 6FRX28 CONTOUR (STENTS) ×1 IMPLANT
SURGIFLO W/THROMBIN 8M KIT (HEMOSTASIS) ×2 IMPLANT
SUT DVC VLOC 3-0 CL 6 P-12 (SUTURE) ×2 IMPLANT
SUT ETHILON 3 0 PS 1 (SUTURE) ×2 IMPLANT
SUT MNCRL 3 0 RB1 (SUTURE) ×3 IMPLANT
SUT MONOCRYL 3 0 RB1 (SUTURE) ×2
SUT VIC AB 4-0 RB1 27 (SUTURE) ×62
SUT VIC AB 4-0 RB1 27X BRD (SUTURE) IMPLANT
SUT VICRYL 0 AB UR-6 (SUTURE) ×2 IMPLANT
SUT VICRYL 0 TIES 12 18 (SUTURE) ×1 IMPLANT
SYR 20ML LL LF (SYRINGE) ×2 IMPLANT
SYS BAG RETRIEVAL 10MM (BASKET) ×1
SYSTEM BAG RETRIEVAL 10MM (BASKET) IMPLANT
TAPE CLOTH 3X10 WHT NS LF (GAUZE/BANDAGES/DRESSINGS) ×2 IMPLANT
TRAY FOLEY MTR SLVR 16FR STAT (SET/KITS/TRAYS/PACK) ×2 IMPLANT
TROCAR ENDOPATH XCEL 12X100 BL (ENDOMECHANICALS) ×2 IMPLANT
TROCAR XCEL NON-BLD 5MMX100MML (ENDOMECHANICALS) ×3 IMPLANT
TUBING CONNECTING 10 (TUBING) ×2 IMPLANT
VALVE UROSEAL ADJ ENDO (VALVE) ×1 IMPLANT
WATER STERILE IRR 1000ML POUR (IV SOLUTION) ×1 IMPLANT
WATER STERILE IRR 500ML POUR (IV SOLUTION) ×1 IMPLANT

## 2021-08-24 NOTE — Op Note (Signed)
Date of procedure: 08/24/21 ? ?Preoperative diagnosis:  ?Left UPJ obstruction ?Left nephrolithiasis  ? ?Postoperative diagnosis:  ?Same ? ?Procedure: ?Left robotic dismembered pyeloplasty ?Left ureteral stent placement ?Robotic stone removal ? ?Surgeon: Legrand Rams, MD ? ?Anesthesia: General ? ?Complications: None ? ?Intraoperative findings:  ?No obvious evidence of crossing vessel ?Dilated left renal pelvis ?Three large stones removed from left renal pelvis ?Uncomplicated dismembered robotic pyeloplasty and stent placement ? ?EBL: 50 mL ? ?Specimens: None ? ?Drains: Left 6 French by 28 cm ureteral stent, 16 French Foley ? ?Indication: Vanessa Hester is a 26 y.o. 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  After reviewing the management options for treatment, they elected to proceed with the above surgical procedure(s). We have discussed the potential benefits and risks of the procedure, side effects of the proposed treatment, the likelihood of the patient achieving the goals of the procedure, and any potential problems that might occur during the procedure or recuperation. Informed consent has been obtained. ? ?Description of procedure: ? ?The patient was taken to the operating room and general anesthesia was induced. SCDs were placed and 5000 units subcutaneous heparin given. The patient was placed in the modified flank position with the left side up prepped and draped in the usual sterile fashion, and preoperative antibiotics(Ancef) were administered. A preoperative time-out was performed.  ? ?A 16 French Foley passed easily into the bladder with return of yellow urine, and 10 mm were placed in the balloon. ? ?A Veress needle was used to obtain access in the left upper quadrant, and saline drop test confirmed intra-abdominal positioning. The abdomen was insufflated to 15 mmHg,  and the robotic ports were placed in standard fashion under direct vision. A Carter-Thomason device was used to pre-place an 0-vicryl suture in the medial assistant port under direct vision. Thorough inspection of the abdomen showed a small amount of mesenteric gas, but no bowel injury or other abnormal findings. ? ?I started by reflecting the descending colon medially to reveal the left kidney.  Further dissection revealed the gonadal, and more medial of the ureter.  This was clearly seen to peristalsis.  The ureter was followed proximally towards the renal pelvis, which appeared to be very dilated.  There was no obvious evidence of a crossing vessel. ? ?At this point I made an incision across the UPJ with the robotic scissors, and this was opened further medially to allow inspection of the renal pelvis.  A flexible cystoscope was passed through one of the robotic ports and into the renal pelvis and used to basket out 2 of the renal pelvis stones.  This was very challenging secondary to the size of the stones and the nephrostomy tube location, and so the nephrostomy tube was cut at the skin and removed entirely.  The third stone was too large for the basket, and I opened the renal pelvis further medially until I was able to identify and grasp the stone and this was removed. ? ?At this point there was a large spatulation medially on the renal pelvis, and I closed this back towards the UPJ using a running 3 -0 V-Loc Vicryl suture.  The ureter was spatulated laterally.  An interrupted 4-0 Vicryl was then used to bring the lateral aspect of the renal pelvis to the spatulated lateral area of the ureter, and this was reinforced with a second interrupted suture.  I then used interrupted sutures to close the posterior wall of the anastomosis towards the 3-0 V lock Vicryl suture on the medial aspect of the renal pelvis.  Once the posterior wall was closed, a 6 [place] by 28 cm ureteral stent was advanced over wire and the  wire passed through the assistant port and down the ureter into the bladder.  The wire was removed and the proximal curl was advanced into the renal pelvis.  I then used interrupted 4-0 Vicryl sutures on the anterior portion of the anastomosis.  When I got to the most medial aspect of the anastomosis, the final suture was tied to the 3-0 V-Loc Vicryl suture that had been used to close the medial aspect of the renal pelvis that was opened to facilitate stone removal ? ?There appeared to be excellent hemostasis, and Gerota's fascia was reapproximated using Weck clips over the anastomosis.  A small amount of Surgicel was placed near the proximal ureter. ? ?All port sites were irrigated, injected with local anesthetic, and closed with a 4-0 Monocryl and Dermabond. ? ?A drain was placed in the left upper quadrant and secured to the skin.  ? ? ?Disposition: Stable to PACU ? ?Plan: ?Monitor drain output ?Consider Foley removal and discharge tomorrow pending clinical progress ?Stent will remain in place for 6 weeks ? ?Legrand Rams, MD ? ? ? ?

## 2021-08-24 NOTE — Progress Notes (Signed)
Pt informed this RN that she is experiencing sharp left chest pain scaled 8/10 that radiates to the left shoulder for about 10 minutes. Vital signs taken. EKG obtained and placed in pt chart. PRN pain medicine administered. Dr. Vanna Scotland of Urology informed. Will continue to monitor pt.  ? ? 08/24/21 2200  ?Vitals  ?Temp 98.8 ?F (37.1 ?C)  ?Temp Source Oral  ?BP 134/69  ?BP Location Right Arm  ?BP Method Automatic  ?Patient Position (if appropriate) Lying  ?Pulse Rate 90  ?Pulse Rate Source Dinamap  ?Resp 16  ?MEWS COLOR  ?MEWS Score Color Green  ?Oxygen Therapy  ?SpO2 98 %  ?O2 Device Room Air  ?Pain Assessment  ?Pain Scale 0-10  ?Pain Score 8  ?Pain Type Acute pain  ?Pain Location Chest  ?Pain Orientation Left  ?Pain Radiating Towards L shoulder  ?Pain Descriptors / Indicators Pressure;Sharp  ?Pain Onset On-going  ?Pain Intervention(s) Medication (See eMAR);MD notified (Comment)  ?MEWS Score  ?MEWS Temp 0  ?MEWS Systolic 0  ?MEWS Pulse 0  ?MEWS RR 0  ?MEWS LOC 0  ?MEWS Score 0  ? ? ?

## 2021-08-24 NOTE — H&P (Signed)
? ?  08/24/21 ?11:42 AM  ? ?Vanessa Hester ?December 29, 1995 ?[id number] ? ?CC: Left UPJ obstruction, nephrolithiasis ? ?HPI: ?26 year old female who presented with left-sided flank pain was found to have a crossing vessel as the etiology of the left-sided UPJ obstruction with left hydronephrosis that has gradually worsened over the last few years.  There were also 3 1 cm left renal stones.  She ultimately underwent a left-sided nephrostomy tube placement for refractory pain.  She denies any fevers or chills at this time. ? ? ?PMH: ?Past Medical History:  ?Diagnosis Date  ? Anemia   ? Anxiety   ? Depression   ? Febrile seizure (HCC) 10/10/2014  ? When she was an infany  ? Febrile seizure (HCC)   ? History of kidney stones   ? Ovarian cyst   ? 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 ? ?Surgical History: ?Past Surgical History:  ?Procedure Laterality Date  ? KNEE SURGERY Left   ? TUMOR REMOVAL  10/10/14  ? 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   ? ? ? ? ?Family History: ?Family History  ?Problem Relation Age of Onset  ? Migraines Father   ? Hypertension Father   ? Cancer Maternal Grandmother 60  ?     colon and uterine  ? Cancer Maternal Grandfather   ?     prostate  ? Arthritis Paternal Grandmother   ? Diabetes Paternal Grandmother   ? Asthma Paternal Grandfather   ? Depression Mother   ? Cancer Maternal Aunt 40  ?     breast  ? Diabetes Maternal Uncle   ? Mental illness Paternal Aunt   ? ? ?Social History:  reports that she quit smoking about 4 years ago. Her smoking use included e-cigarettes and cigarettes. She has never used smokeless tobacco. She reports current alcohol use. She reports current drug use. Drugs: Marijuana and MDMA (Ecstacy). ? ?Physical Exam: ?BP 124/67   Pulse 75   Temp 98.1 ?F (36.7 ?C) (Oral)   Resp 18   Ht 5\' 5"  (1.651 m)   Wt 104 kg   SpO2 98%   BMI 38.15 kg/m?   ? ?Constitutional:  Alert and oriented, No acute distress. ?Cardiovascular: No clubbing, cyanosis, or edema. ?Respiratory: Normal respiratory effort, no  increased work of breathing. ?GI: Abdomen is soft, nontender, nondistended, no abdominal masses ?GU: Left nephrostomy tube with clear yellow urine ? ?Laboratory Data: ?Reviewed ?Urine culture 08/11/2021 25-50k mixed flora ? ?Assessment & Plan:   ?26 year old female with left UPJ obstruction secondary to a lower pole crossing vessel, as well as 3 large stones in the left kidney.  She has been managed with a nephrostomy tube and presents today for definitive robotic left-sided pyeloplasty and stone removal.  Risk and benefits discussed including bleeding, infection, stricture, damage to surrounding structures, need for long-term monitoring, urine leak, stent related symptoms ? ?Left robotic pyeloplasty and stone removal ? ?22, MD ?08/24/2021 ? ?[facility] ?[address], [address] ?[place], [place] ?[phone] ? ? ?

## 2021-08-24 NOTE — Transfer of Care (Signed)
Immediate Anesthesia Transfer of Care Note ? ?Patient: Vanessa Hester ? ?Procedure(s) Performed: XI ROBOTIC ASSISTED PYELOPLASTY WITH STENT PLACEMENT (Left) ? ?Patient Location: PACU ? ?Anesthesia Type:General ? ?Level of Consciousness: awake, drowsy and patient cooperative ? ?Airway & Oxygen Therapy: Patient Spontanous Breathing ? ?Post-op Assessment: Report given to RN, Post -op Vital signs reviewed and stable and Patient moving all extremities ? ?Post vital signs: Reviewed and stable ? ?Last Vitals:  ?Vitals Value Taken Time  ?BP 132/78 08/24/21 1700  ?Temp    ?Pulse 72 08/24/21 1703  ?Resp 9 08/24/21 1703  ?SpO2 99 % 08/24/21 1703  ?Vitals shown include unvalidated device data. ? ?Last Pain:  ?Vitals:  ? 08/24/21 1012  ?TempSrc: Oral  ?PainSc: 6   ?   ? ?Patients Stated Pain Goal: 1 (08/24/21 1012) ? ?Complications: No notable events documented. ?

## 2021-08-24 NOTE — Anesthesia Procedure Notes (Addendum)
Procedure Name: Intubation ?Date/Time: 08/24/2021 12:26 PM ?Performed by: Lowry Bowl, CRNA ?Pre-anesthesia Checklist: Patient identified, Emergency Drugs available, Suction available and Patient being monitored ?Patient Re-evaluated:Patient Re-evaluated prior to induction ?Oxygen Delivery Method: Circle system utilized ?Preoxygenation: Pre-oxygenation with 100% oxygen ?Induction Type: IV induction ?Ventilation: Mask ventilation without difficulty ?Laryngoscope Size: 3 and McGraph ?Grade View: Grade I ?Tube type: Oral ?Tube size: 7.0 mm ?Number of attempts: 1 ?Airway Equipment and Method: Stylet and Video-laryngoscopy ?Placement Confirmation: ETT inserted through vocal cords under direct vision, positive ETCO2 and breath sounds checked- equal and bilateral ?Secured at: 21 cm ?Tube secured with: Tape ?Dental Injury: Teeth and Oropharynx as per pre-operative assessment  ?Comments: Placed by Jetty Peeks ? ? ? ? ?

## 2021-08-24 NOTE — Progress Notes (Signed)
Patient transferred to [facility] room 227, upon arrival to room patient c/o's right eye "burning" ?Visible inspection , sclera white nothing noted. Accepting RN aware.  ?

## 2021-08-24 NOTE — Anesthesia Preprocedure Evaluation (Signed)
Anesthesia Evaluation  ?Patient identified by MRN, date of birth, ID band ?Patient awake ? ? ? ?Reviewed: ?Allergy & Precautions, NPO status , Patient's Chart, lab work & pertinent test results ? ?History of Anesthesia Complications ?(+) PONV and history of anesthetic complications ? ?Airway ?Mallampati: I ? ?TM Distance: >3 FB ?Neck ROM: Full ? ? ? Dental ?no notable dental hx. ?(+) Teeth Intact ?  ?Pulmonary ?neg pulmonary ROS, neg sleep apnea, neg COPD, Patient abstained from smoking.Not current smoker, former smoker,  ?Vapes ?  ?Pulmonary exam normal ?breath sounds clear to auscultation ? ? ? ? ? ? Cardiovascular ?Exercise Tolerance: Good ?METS(-) hypertension(-) CAD and (-) Past MI negative cardio ROS ? ?(-) dysrhythmias  ?Rhythm:Regular Rate:Normal ?- Systolic murmurs ? ?  ?Neuro/Psych ? Headaches, PSYCHIATRIC DISORDERS Anxiety Depression negative neurological ROS ?   ? GI/Hepatic ?neg GERD  ,(+)  ?  ? (-) substance abuse ? ,   ?Endo/Other  ?neg diabetes ? Renal/GU ?Renal disease  ? ?  ?Musculoskeletal ? ? Abdominal ?(+) + obese,   ?Peds ? Hematology ? ?(+) Blood dyscrasia, anemia ,   ?Anesthesia Other Findings ?Past Medical History: ?No date: Anemia ?No date: Anxiety ?No date: Depression ?10/10/2014: Febrile seizure (HCC) ?    Comment:  When she was an infany ?No date: Febrile seizure (HCC) ?No date: History of kidney stones ?No date: Ovarian cyst ?12/12/2020: Syphilis ? Reproductive/Obstetrics ? ?  ? ? ? ? ? ? ? ? ? ? ? ? ? ?  ?  ? ? ? ? ? ? ? ?Anesthesia Physical ?Anesthesia Plan ? ?ASA: 2 ? ?Anesthesia Plan: General  ? ?Post-op Pain Management: Ofirmev IV (intra-op)*, Toradol IV (intra-op)* and Dilaudid IV  ? ?Induction: Intravenous ? ?PONV Risk Score and Plan: 4 or greater and Ondansetron, Dexamethasone, Midazolam, TIVA and Propofol infusion ? ?Airway Management Planned: Oral ETT ? ?Additional Equipment: None ? ?Intra-op Plan:  ? ?Post-operative Plan: Extubation in  OR ? ?Informed Consent: I have reviewed the patients History and Physical, chart, labs and discussed the procedure including the risks, benefits and alternatives for the proposed anesthesia with the patient or authorized representative who has indicated his/her understanding and acceptance.  ? ? ? ?Dental advisory given ? ?Plan Discussed with: CRNA and Surgeon ? ?Anesthesia Plan Comments: (Discussed risks of anesthesia with patient, including PONV, sore throat, lip/dental/eye damage. Rare risks discussed as well, such as cardiorespiratory and neurological sequelae, and allergic reactions. Discussed the role of CRNA in patient's perioperative care. Patient understands.)  ? ? ? ? ? ? ?Anesthesia Quick Evaluation ? ?

## 2021-08-25 ENCOUNTER — Encounter: Payer: Self-pay | Admitting: Urology

## 2021-08-25 DIAGNOSIS — N130 Hydronephrosis with ureteropelvic junction obstruction: Secondary | ICD-10-CM | POA: Diagnosis not present

## 2021-08-25 DIAGNOSIS — N2889 Other specified disorders of kidney and ureter: Secondary | ICD-10-CM

## 2021-08-25 DIAGNOSIS — N200 Calculus of kidney: Secondary | ICD-10-CM

## 2021-08-25 LAB — CBC
HCT: 40.7 % (ref 36.0–46.0)
Hemoglobin: 13 g/dL (ref 12.0–15.0)
MCH: 27.5 pg (ref 26.0–34.0)
MCHC: 31.9 g/dL (ref 30.0–36.0)
MCV: 86 fL (ref 80.0–100.0)
Platelets: 295 10*3/uL (ref 150–400)
RBC: 4.73 MIL/uL (ref 3.87–5.11)
RDW: 14.3 % (ref 11.5–15.5)
WBC: 20.8 10*3/uL — ABNORMAL HIGH (ref 4.0–10.5)
nRBC: 0 % (ref 0.0–0.2)

## 2021-08-25 LAB — BASIC METABOLIC PANEL
Anion gap: 6 (ref 5–15)
BUN: 17 mg/dL (ref 6–20)
CO2: 25 mmol/L (ref 22–32)
Calcium: 8.6 mg/dL — ABNORMAL LOW (ref 8.9–10.3)
Chloride: 105 mmol/L (ref 98–111)
Creatinine, Ser: 0.91 mg/dL (ref 0.44–1.00)
GFR, Estimated: >60 mL/min (ref >60)
Glucose, Bld: 130 mg/dL — ABNORMAL HIGH (ref 70–99)
Potassium: 4.6 mmol/L (ref 3.5–5.1)
Sodium: 136 mmol/L (ref 135–145)

## 2021-08-25 MED ORDER — DOCUSATE SODIUM 100 MG PO CAPS: 100.0000 mg | ORAL_CAPSULE | Freq: Two times a day (BID) | ORAL | 0 refills | Status: DC

## 2021-08-25 MED ORDER — SULFAMETHOXAZOLE-TRIMETHOPRIM 400-80 MG PO TABS: 1.0000 | ORAL_TABLET | Freq: Every day | ORAL | Status: DC

## 2021-08-25 MED ORDER — SULFAMETHOXAZOLE-TRIMETHOPRIM 800-160 MG PO TABS: 1.0000 | ORAL_TABLET | Freq: Every day | ORAL | 0 refills | Status: DC

## 2021-08-25 MED ORDER — OXYBUTYNIN CHLORIDE ER 10 MG PO TB24: 10.0000 mg | ORAL_TABLET | Freq: Every day | ORAL | 1 refills | Status: DC

## 2021-08-25 MED ORDER — OXYCODONE HCL 5 MG PO TABS: 5.0000 mg | ORAL_TABLET | Freq: Four times a day (QID) | ORAL | 0 refills | Status: DC | PRN

## 2021-08-25 NOTE — Discharge Summary (Signed)
Date of admission: 08/24/2021 ? ?Date of discharge: 08/25/2021 ? ?Admission diagnosis: Left UPJ obstruction, left nephrolithiasis ? ?Discharge diagnosis: Same as above ? ?Secondary diagnoses:  ?Patient Active Problem List  ? Diagnosis Date Noted  ? Ureteropelvic junction (UPJ) obstruction, left 08/24/2021  ? Hydronephrosis due to obstruction of ureter 05/30/2021  ? Hypokalemia 05/29/2021  ? Obesity (BMI 35.0-39.9 without comorbidity) 05/29/2021  ? Acute pyelonephritis 05/28/2021  ? Nexplanon in place 06/07/2018  ? Cellulitis of foot, right 03/01/2018  ? 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 Chlamydia infection 11/02/2016  ? Ovarian cyst 07/22/2016  ? Anemia 02/05/2015  ? Depression 02/05/2015  ? Menorrhagia 02/04/2015  ? Fatigue 02/04/2015  ? Tension headache 02/04/2015  ? IBS (irritable bowel syndrome) 02/04/2015  ? Vitamin D deficiency 02/04/2015  ? Dysfunctional uterine bleeding 09/20/2013  ? ?History and Physical: For full details, please see admission history and physical. Briefly, Vanessa Hester is a 26 y.o. year old patient admitted on 08/24/2021 for scheduled left robotic pyeloplasty with ureteral stent placement and stone removal with Dr. Diamantina Providence.  ? ?This morning she reports some midline abdominal and bladder discomfort worse with ambulation.  She is tolerating p.o. without nausea or vomiting.  Foley catheter in place draining clear, pink urine.  Left JP drain in place draining serosanguineous fluid. ? ?Foley catheter was removed in the morning and patient reports improvement in bladder spasms. As of this afternoon, her pain is well controlled and she continues to ambulate. She has passed flatus. ? ?Physical Exam: ?Constitutional:  Alert and oriented, no acute distress, nontoxic appearing ?HEENT: [place], AT ?Cardiovascular: No clubbing, cyanosis, or edema ?Respiratory: Normal respiratory effort, no increased work of breathing ?GI: Abdomen is soft, nondistended. ?Skin: No rashes, bruises or suspicious  lesions ?Neurologic: Grossly intact, no focal deficits, moving all 4 extremities ?Psychiatric: Normal mood and affect  ? ?Hospital Course: Patient tolerated the procedure well.  She was then transferred to the floor after an uneventful PACU stay.  Her hospital course was uncomplicated.  On POD#1 she had met discharge criteria: was eating a regular diet, was up and ambulating independently,  pain was well controlled, was voiding without a catheter, drain was removed, and was ready for discharge. ? ?Laboratory values:  ?Recent Labs  ?  08/25/21 ?0456  ?WBC 20.8*  ?HGB 13.0  ?HCT 40.7  ? ?Recent Labs  ?  08/25/21 ?0456  ?NA 136  ?K 4.6  ?CL 105  ?CO2 25  ?GLUCOSE 130*  ?BUN 17  ?CREATININE 0.91  ?CALCIUM 8.6*  ? ?Results for orders placed or performed in visit on 08/11/21  ?CULTURE, URINE COMPREHENSIVE     Status: None  ? Collection Time: 08/11/21  8:58 AM  ? Specimen: Urine  ? UR  ?Result Value Ref Range Status  ? Urine Culture, Comprehensive Final report  Final  ? Organism ID, Bacteria Comment  Final  ?  Comment: Mixed urogenital flora ?25,000-50,000 colony forming units per mL ?  ?Microscopic Examination     Status: Abnormal  ? Collection Time: 08/11/21  8:58 AM  ? Urine  ?Result Value Ref Range Status  ? WBC, UA 11-30 (A) 0 - 5 /hpf Final  ? RBC 3-10 (A) 0 - 2 /hpf Final  ? Epithelial Cells (non renal) 0-10 0 - 10 /hpf Final  ? Crystals Present (A) N/A Final  ? Crystal Type Amorphous Sediment N/A Final  ? Bacteria, UA Moderate (A) None seen/Few Final  ? Trichomonas, UA Present (A) None seen Final  ? ?  Disposition: Home ? ?Discharge instruction: The patient was instructed to be ambulatory but told to refrain from heavy lifting, strenuous activity, or driving. ? ?Discharge medications:  ?Allergies as of 08/25/2021   ? ?   Reactions  ? Other Shortness Of Breath  ? Medication is "Durahistamine" used for coughing when she was a child  ? Benadryl [diphenhydramine] Hives  ? Clindamycin/lincomycin Nausea And Vomiting  ?  Scratchy throat  ? Gardasil 9 [hpv 9-valent Recomb Vaccine]   ? Unknown reaction  ? Amoxicillin Hives  ? Latex Rash  ? ?  ? ?  ?Medication List  ?  ? ?TAKE these medications   ? ?acetaminophen 325 MG tablet ?Commonly known as: TYLENOL ?Take 650 mg by mouth every 6 (six) hours as needed for mild pain. ?  ?docusate sodium 100 MG capsule ?Commonly known as: COLACE ?Take 1 capsule (100 mg total) by mouth 2 (two) times daily. ?  ?escitalopram 10 MG tablet ?Commonly known as: LEXAPRO ?Take 10 mg by mouth daily. ?  ?ketorolac 10 MG tablet ?Commonly known as: TORADOL ?Take 10 mg by mouth every 6 (six) hours as needed for pain. ?  ?metroNIDAZOLE 500 MG tablet ?Commonly known as: Flagyl ?Take 1 tablet (500 mg total) by mouth 4 (four) times daily for 14 days. ?  ?ondansetron 4 MG tablet ?Commonly known as: ZOFRAN ?Take 4 mg by mouth every 8 (eight) hours as needed for vomiting or nausea. ?  ?oxybutynin 10 MG 24 hr tablet ?Commonly known as: DITROPAN-XL ?Take 1 tablet (10 mg total) by mouth daily. ?  ?oxyCODONE 5 MG immediate release tablet ?Commonly known as: Oxy IR/ROXICODONE ?Take 1-2 tablets (5-10 mg total) by mouth every 6 (six) hours as needed for moderate pain. ?What changed:  ?how much to take ?when to take this ?reasons to take this ?  ?sulfamethoxazole-trimethoprim 800-160 MG tablet ?Commonly known as: BACTRIM DS ?Take 1 tablet by mouth daily. ?What changed: when to take this ?  ?traZODone 50 MG tablet ?Commonly known as: DESYREL ?Take 1 tablet (50 mg total) by mouth at bedtime. ?  ? ?  ? ? ?Followup:  ? Follow-up Information   ? ? Billey Co, MD Follow up on 10/14/2021.   ?Specialty: Urology ?Why: for stent removal ?Contact information: ?[place] [address] ?[phone] ? ? ?  ?  ? ?  ?  ? ?  ?  ?

## 2021-08-25 NOTE — TOC CM/SW Note (Signed)
Patient has orders to discharge home today. Chart reviewed. No TOC needs identified. CSW signing off.  Gerad Cornelio, CSW [phone]  

## 2021-08-25 NOTE — Anesthesia Postprocedure Evaluation (Signed)
Anesthesia Post Note ? ?Patient: Vanessa Hester ? ?Procedure(s) Performed: XI ROBOTIC ASSISTED PYELOPLASTY WITH STENT PLACEMENT (Left) ? ?Patient location during evaluation: PACU ?Anesthesia Type: General ?Level of consciousness: awake and alert ?Pain management: pain level controlled ?Vital Signs Assessment: post-procedure vital signs reviewed and stable ?Respiratory status: spontaneous breathing, nonlabored ventilation and respiratory function stable ?Cardiovascular status: blood pressure returned to baseline and stable ?Postop Assessment: no apparent nausea or vomiting ?Anesthetic complications: no ? ? ?No notable events documented. ? ? ?Last Vitals:  ?Vitals:  ? 08/25/21 0027 08/25/21 0423  ?BP: 125/72 115/63  ?Pulse: 98 92  ?Resp: 18 18  ?Temp: 36.9 ?C 36.9 ?C  ?SpO2: 95% 97%  ?  ?Last Pain:  ?Vitals:  ? 08/25/21 0820  ?TempSrc:   ?PainSc: 0-No pain  ? ? ?  ?  ?  ?  ?  ?  ? ?Foye Deer ? ? ? ? ?

## 2021-08-27 ENCOUNTER — Other Ambulatory Visit: Payer: Self-pay | Admitting: Urology

## 2021-08-27 ENCOUNTER — Encounter: Payer: Self-pay | Admitting: Urology

## 2021-08-27 ENCOUNTER — Ambulatory Visit
Admission: RE | Admit: 2021-08-27 | Discharge: 2021-08-27 | Disposition: A | Discharge status: Home / Self Care | Payer: BC Managed Care – PPO | Source: Ambulatory Visit | Attending: Urology | Admitting: Urology

## 2021-08-27 ENCOUNTER — Other Ambulatory Visit: Payer: Self-pay

## 2021-08-27 ENCOUNTER — Ambulatory Visit (INDEPENDENT_AMBULATORY_CARE_PROVIDER_SITE_OTHER): Payer: BC Managed Care – PPO | Admitting: Urology

## 2021-08-27 DIAGNOSIS — R1012 Left upper quadrant pain: Secondary | ICD-10-CM | POA: Diagnosis not present

## 2021-08-27 DIAGNOSIS — N200 Calculus of kidney: Secondary | ICD-10-CM

## 2021-08-27 DIAGNOSIS — R509 Fever, unspecified: Secondary | ICD-10-CM | POA: Diagnosis not present

## 2021-08-27 DIAGNOSIS — R5082 Postprocedural fever: Secondary | ICD-10-CM | POA: Insufficient documentation

## 2021-08-27 DIAGNOSIS — R110 Nausea: Secondary | ICD-10-CM

## 2021-08-27 MED ORDER — ONDANSETRON 4 MG PO TBDP: 4.0000 mg | ORAL_TABLET | Freq: Three times a day (TID) | ORAL | 0 refills | Status: DC | PRN

## 2021-08-27 MED ORDER — SULFAMETHOXAZOLE-TRIMETHOPRIM 800-160 MG PO TABS: 1.0000 | ORAL_TABLET | Freq: Two times a day (BID) | ORAL | 0 refills | Status: DC

## 2021-08-27 MED ORDER — CEFTRIAXONE SODIUM 500 MG IJ SOLR
500.0000 mg | Freq: Once | INTRAMUSCULAR | Status: AC
  Administered 2021-08-27: 500 mg via INTRAMUSCULAR

## 2021-08-27 NOTE — Progress Notes (Signed)
? ?  08/27/2021 ?1:46 PM  ? ?Jamison Oka ?[birth year] ?[id number] ? ?Reason for visit: Post-op concerns ? ?HPI: ?26 year old female status post left robotic dismembered pyeloplasty and stone removal on 08/24/2021.  She was added on to clinic today for reported fevers at home, nausea, and no bowel movement since surgery.  She is having some mild left upper quadrant pain. ? ?Exam: ?Temp 98.1, heart rate 100, blood pressure 108/73 ?Clinically well-appearing, conversational ?Abdomen soft, appropriately tender, non-distended, no peritoneal signs, incisions clean dry and intact ? ?I personally viewed and interpreted her chest x-ray and KUB today.  Chest x-ray with no cardiopulmonary process, some air under the diaphragm consistent with recent laparoscopic surgery, and KUB shows proximal curl over the projected location of the left renal pelvis, and curl over the location of the bladder. ? ?Urinalysis today appears grossly infected with 0 squamous cells, greater than 30 WBCs, greater than 30 RBC, moderate bacteria, nitrite negative, 1+ leukocytes.  Will send for culture. ? ?We discussed possible etiologies of her symptoms at length including UTI, pyelonephritis, bowel obstruction, constipation, urine leak, or bowel injury.  Her exam is relatively benign today and vitals are normal, and urinalysis appears grossly infected for UTI.  Stent appears to be in appropriate position on KUB.  We discussed options including admitting for observation and antibiotics, versus outpatient management.  She prefers outpatient management, and we discussed return precautions extensively.  1 g of ceftriaxone given in clinic, and she was changed to Bactrim DS twice daily for outpatient antibiotics, and Zofran sent in.  I recommended sticking with a brat diet, and could consider trying a suppository from below.  She has some baseline problems with constipation.  She also started her menstrual cycle yesterday. ? ?Ceftriaxone given today IM,  Bactrim DS twice daily for suspected UTI, follow-up urine culture ?Return precautions discussed extensively ?Follow-up next week for symptom check ? ?Legrand Rams, MD ?08/27/2021 ? ?

## 2021-08-28 LAB — URINALYSIS, COMPLETE
Bilirubin, UA: NEGATIVE
Glucose, UA: NEGATIVE
Nitrite, UA: NEGATIVE
Specific Gravity, UA: >1.03 — ABNORMAL HIGH (ref 1.005–1.030)
Urobilinogen, Ur: 0.2 mg/dL (ref 0.2–1.0)
pH, UA: 6 (ref 5.0–7.5)

## 2021-08-28 LAB — MICROSCOPIC EXAMINATION
RBC, Urine: >30 /hpf — AB (ref 0–2)
WBC, UA: >30 /hpf — AB (ref 0–5)

## 2021-08-31 ENCOUNTER — Other Ambulatory Visit: Payer: BC Managed Care – PPO

## 2021-08-31 ENCOUNTER — Other Ambulatory Visit: Payer: Self-pay

## 2021-08-31 DIAGNOSIS — R5082 Postprocedural fever: Secondary | ICD-10-CM

## 2021-09-02 ENCOUNTER — Ambulatory Visit (INDEPENDENT_AMBULATORY_CARE_PROVIDER_SITE_OTHER): Payer: BC Managed Care – PPO | Admitting: Urology

## 2021-09-02 DIAGNOSIS — N135 Crossing vessel and stricture of ureter without hydronephrosis: Secondary | ICD-10-CM

## 2021-09-02 NOTE — Progress Notes (Signed)
Virtual Visit via Telephone Note ? ?I connected with Vanessa Hester on 09/02/21 at 10:45 AM EDT by telephone and verified that I am speaking with the correct person using two identifiers. ?  ?Patient location: Home ?Provider location: [facility] ? ? ?I discussed the limitations, risks, security and privacy concerns of performing an evaluation and management service by telephone and the availability of in person appointments. We discussed the impact of the COVID-19 pandemic on the healthcare system, and the importance of social distancing and reducing patient and provider exposure. I also discussed with the patient that there may be a patient responsible charge related to this service. The patient expressed understanding and agreed to proceed. ? ?History of Present Illness: ?26 year old female status post left robotic dismembered pyeloplasty and stone removal on 08/24/2021.  I saw her in clinic as an add-on on 08/27/2021 for subjective fevers, nausea, and constipation.  She was diagnosed with a UTI at that time based on urinalysis, but unfortunately that was not sent for culture for unclear reasons.  She was given ceftriaxone in clinic and started on Bactrim DS twice daily based on prior culture results.  She also was likely having some mild postop ileus with constipation, but abdominal exam was benign. ? ?She reports she has felt much better over the last week.  She is having normal bowel movements and abdominal pain has improved.  She is having some mild stent related symptoms of some urgency and dysuria.  She is actually only taking the Bactrim once per day, she feels like this is worsening her nausea when she takes it in the morning.  She denies any fevers. ? ?-Finish course of Bactrim, follow-up repeat culture that was sent on 08/31/2021 ?-Return precautions discussed ?-Scheduled for stent removal 10/15/2021 ? ?  ?I discussed the assessment and treatment plan with the patient. The patient was  provided an opportunity to ask questions and all were answered. The patient agreed with the plan and demonstrated an understanding of the instructions. ?  ?The patient was advised to call back or seek an in-person evaluation if the symptoms worsen or if the condition fails to improve as anticipated. ? ?I provided 8 minutes of non-face-to-face time during this encounter. ? ? ?Sondra Come, MD  ? ?

## 2021-09-03 ENCOUNTER — Telehealth: Payer: Self-pay

## 2021-09-03 DIAGNOSIS — B958 Unspecified staphylococcus as the cause of diseases classified elsewhere: Secondary | ICD-10-CM

## 2021-09-03 LAB — CULTURE, URINE COMPREHENSIVE

## 2021-09-03 MED ORDER — CEPHALEXIN 500 MG PO CAPS: 500.0000 mg | ORAL_CAPSULE | Freq: Two times a day (BID) | ORAL | 0 refills | Status: AC

## 2021-09-03 NOTE — Telephone Encounter (Signed)
Called pt no answer. Unable to leave VM as it is not set up. Mychart message sent. RX sent.  ?

## 2021-09-03 NOTE — Telephone Encounter (Signed)
-----   Message from Sondra Come, MD sent at 09/03/2021  8:53 AM EDT ----- ?Please change her to Keflex 500 mg twice daily x7 days, and she can discontinue the Bactrim.  ? ?Thanks ?Legrand Rams, MD ?09/03/2021 ? ? ?

## 2021-09-04 ENCOUNTER — Telehealth: Payer: Self-pay

## 2021-09-04 DIAGNOSIS — A493 Mycoplasma infection, unspecified site: Secondary | ICD-10-CM

## 2021-09-04 MED ORDER — DOXYCYCLINE HYCLATE 100 MG PO CAPS: 100.0000 mg | ORAL_CAPSULE | Freq: Two times a day (BID) | ORAL | 0 refills | Status: DC

## 2021-09-04 NOTE — Telephone Encounter (Signed)
See Mychart Message. RX sent.  ?

## 2021-09-04 NOTE — Telephone Encounter (Signed)
Incoming call from pt who questions if we can check her RPR status with blood test at her next visit. Explained to patient that labs would be at the discretion of the provider, but would likely be able to draw. If positive would refer to health department for treatment.  ?

## 2021-09-04 NOTE — Telephone Encounter (Signed)
-----   Message from Sondra Come, MD sent at 09/04/2021  9:46 AM EDT ----- ?Atypical culture also positive for mycoplasma.  She should also take doxycycline 100 mg twice daily x7 days.  I know this has been a lot of antibiotics, but I think having the stones there long-term has contributed to these infections and should improve after surgery ? ?Legrand Rams, MD ?09/04/2021 ? ? ?

## 2021-09-07 LAB — MYCOPLASMA / UREAPLASMA CULTURE
Mycoplasma hominis Culture: POSITIVE — AB
Ureaplasma urealyticum: NEGATIVE

## 2021-09-09 ENCOUNTER — Encounter: Payer: 59 | Admitting: Urology

## 2021-09-20 ENCOUNTER — Inpatient Hospital Stay
Admission: EM | Admit: 2021-09-20 | Discharge: 2021-09-22 | DRG: 872 | Disposition: A | Discharge status: Home / Self Care | Payer: BC Managed Care – PPO | Attending: Internal Medicine | Admitting: Internal Medicine

## 2021-09-20 ENCOUNTER — Emergency Department: Payer: BC Managed Care – PPO

## 2021-09-20 ENCOUNTER — Other Ambulatory Visit: Payer: Self-pay

## 2021-09-20 DIAGNOSIS — Z87891 Personal history of nicotine dependence: Secondary | ICD-10-CM | POA: Diagnosis not present

## 2021-09-20 DIAGNOSIS — N136 Pyonephrosis: Secondary | ICD-10-CM | POA: Diagnosis present

## 2021-09-20 DIAGNOSIS — R1030 Lower abdominal pain, unspecified: Secondary | ICD-10-CM | POA: Diagnosis present

## 2021-09-20 DIAGNOSIS — D649 Anemia, unspecified: Secondary | ICD-10-CM | POA: Diagnosis present

## 2021-09-20 DIAGNOSIS — N281 Cyst of kidney, acquired: Secondary | ICD-10-CM | POA: Diagnosis present

## 2021-09-20 DIAGNOSIS — Z6839 Body mass index (BMI) 39.0-39.9, adult: Secondary | ICD-10-CM

## 2021-09-20 DIAGNOSIS — N390 Urinary tract infection, site not specified: Secondary | ICD-10-CM | POA: Diagnosis not present

## 2021-09-20 DIAGNOSIS — F32A Depression, unspecified: Secondary | ICD-10-CM | POA: Diagnosis present

## 2021-09-20 DIAGNOSIS — N131 Hydronephrosis with ureteral stricture, not elsewhere classified: Secondary | ICD-10-CM | POA: Diagnosis present

## 2021-09-20 DIAGNOSIS — N10 Acute tubulo-interstitial nephritis: Secondary | ICD-10-CM | POA: Diagnosis not present

## 2021-09-20 DIAGNOSIS — F418 Other specified anxiety disorders: Secondary | ICD-10-CM | POA: Diagnosis not present

## 2021-09-20 DIAGNOSIS — Z87442 Personal history of urinary calculi: Secondary | ICD-10-CM

## 2021-09-20 DIAGNOSIS — N3001 Acute cystitis with hematuria: Secondary | ICD-10-CM

## 2021-09-20 DIAGNOSIS — F419 Anxiety disorder, unspecified: Secondary | ICD-10-CM | POA: Diagnosis present

## 2021-09-20 DIAGNOSIS — Z960 Presence of urogenital implants: Secondary | ICD-10-CM | POA: Diagnosis present

## 2021-09-20 DIAGNOSIS — E669 Obesity, unspecified: Secondary | ICD-10-CM | POA: Diagnosis present

## 2021-09-20 DIAGNOSIS — A419 Sepsis, unspecified organism: Principal | ICD-10-CM | POA: Diagnosis present

## 2021-09-20 LAB — URINALYSIS, ROUTINE W REFLEX MICROSCOPIC
Bilirubin Urine: NEGATIVE
Glucose, UA: NEGATIVE mg/dL
Ketones, ur: NEGATIVE mg/dL
Nitrite: POSITIVE — AB
Protein, ur: 100 mg/dL — AB
RBC / HPF: >50 RBC/hpf — ABNORMAL HIGH (ref 0–5)
Specific Gravity, Urine: 1.015 (ref 1.005–1.030)
WBC, UA: >50 WBC/hpf — ABNORMAL HIGH (ref 0–5)
pH: 6 (ref 5.0–8.0)

## 2021-09-20 LAB — CBC
HCT: 37.9 % (ref 36.0–46.0)
Hemoglobin: 11.7 g/dL — ABNORMAL LOW (ref 12.0–15.0)
MCH: 26.8 pg (ref 26.0–34.0)
MCHC: 30.9 g/dL (ref 30.0–36.0)
MCV: 86.9 fL (ref 80.0–100.0)
Platelets: 358 10*3/uL (ref 150–400)
RBC: 4.36 MIL/uL (ref 3.87–5.11)
RDW: 13.5 % (ref 11.5–15.5)
WBC: 17 10*3/uL — ABNORMAL HIGH (ref 4.0–10.5)
nRBC: 0 % (ref 0.0–0.2)

## 2021-09-20 LAB — BASIC METABOLIC PANEL
Anion gap: 9 (ref 5–15)
BUN: 10 mg/dL (ref 6–20)
CO2: 24 mmol/L (ref 22–32)
Calcium: 8.8 mg/dL — ABNORMAL LOW (ref 8.9–10.3)
Chloride: 102 mmol/L (ref 98–111)
Creatinine, Ser: 1.01 mg/dL — ABNORMAL HIGH (ref 0.44–1.00)
GFR, Estimated: >60 mL/min (ref >60)
Glucose, Bld: 111 mg/dL — ABNORMAL HIGH (ref 70–99)
Potassium: 3.7 mmol/L (ref 3.5–5.1)
Sodium: 135 mmol/L (ref 135–145)

## 2021-09-20 LAB — LACTIC ACID, PLASMA
Lactic Acid, Venous: 1.9 mmol/L (ref 0.5–1.9)
Lactic Acid, Venous: 1.1 mmol/L (ref 0.5–1.9)

## 2021-09-20 LAB — PROTIME-INR
INR: 1.2 (ref 0.8–1.2)
Prothrombin Time: 14.9 seconds (ref 11.4–15.2)

## 2021-09-20 LAB — APTT: aPTT: 33 seconds (ref 24–36)

## 2021-09-20 LAB — PROCALCITONIN: Procalcitonin: 0.33 ng/mL

## 2021-09-20 LAB — POC URINE PREG, ED: Preg Test, Ur: NEGATIVE

## 2021-09-20 MED ORDER — OXYBUTYNIN CHLORIDE ER 10 MG PO TB24
10.0000 mg | ORAL_TABLET | Freq: Every day | ORAL | Status: DC
  Administered 2021-09-20 – 2021-09-22 (×3): 10 mg via ORAL
  Filled 2021-09-20 (×3): qty 1

## 2021-09-20 MED ORDER — MORPHINE SULFATE (PF) 4 MG/ML IV SOLN
4.0000 mg | Freq: Once | INTRAVENOUS | Status: AC
  Administered 2021-09-20: 4 mg via INTRAVENOUS
  Filled 2021-09-20: qty 1

## 2021-09-20 MED ORDER — LACTATED RINGERS IV SOLN: INTRAVENOUS | Status: DC

## 2021-09-20 MED ORDER — SODIUM CHLORIDE 0.9 % IV SOLN
2.0000 g | INTRAVENOUS | Status: DC
  Administered 2021-09-21 – 2021-09-22 (×2): 2 g via INTRAVENOUS
  Filled 2021-09-20 (×2): qty 2

## 2021-09-20 MED ORDER — DOCUSATE SODIUM 100 MG PO CAPS
100.0000 mg | ORAL_CAPSULE | Freq: Every day | ORAL | Status: DC | PRN
  Filled 2021-09-20: qty 1

## 2021-09-20 MED ORDER — SODIUM CHLORIDE 0.9 % IV SOLN
1.0000 g | Freq: Once | INTRAVENOUS | Status: AC
  Administered 2021-09-20: 1 g via INTRAVENOUS
  Filled 2021-09-20: qty 10

## 2021-09-20 MED ORDER — OXYCODONE HCL 5 MG PO TABS
5.0000 mg | ORAL_TABLET | Freq: Four times a day (QID) | ORAL | Status: DC | PRN
  Administered 2021-09-20 – 2021-09-22 (×4): 10 mg via ORAL
  Filled 2021-09-20 (×4): qty 2

## 2021-09-20 MED ORDER — LACTATED RINGERS IV BOLUS
1000.0000 mL | Freq: Once | INTRAVENOUS | Status: AC
  Administered 2021-09-20: 1000 mL via INTRAVENOUS

## 2021-09-20 MED ORDER — MORPHINE SULFATE (PF) 2 MG/ML IV SOLN
2.0000 mg | INTRAVENOUS | Status: DC | PRN
  Administered 2021-09-20 – 2021-09-21 (×3): 2 mg via INTRAVENOUS
  Filled 2021-09-20 (×3): qty 1

## 2021-09-20 MED ORDER — ONDANSETRON HCL 4 MG/2ML IJ SOLN
4.0000 mg | Freq: Three times a day (TID) | INTRAMUSCULAR | Status: DC | PRN
  Administered 2021-09-20 – 2021-09-21 (×3): 4 mg via INTRAVENOUS
  Filled 2021-09-20 (×3): qty 2

## 2021-09-20 MED ORDER — IOHEXOL 300 MG/ML  SOLN
100.0000 mL | Freq: Once | INTRAMUSCULAR | Status: AC | PRN
  Administered 2021-09-20: 100 mL via INTRAVENOUS

## 2021-09-20 MED ORDER — ONDANSETRON 4 MG PO TBDP
4.0000 mg | ORAL_TABLET | Freq: Once | ORAL | Status: AC
  Administered 2021-09-20: 4 mg via ORAL
  Filled 2021-09-20: qty 1

## 2021-09-20 MED ORDER — TRAZODONE HCL 50 MG PO TABS
50.0000 mg | ORAL_TABLET | Freq: Every day | ORAL | Status: DC
  Administered 2021-09-20 – 2021-09-21 (×2): 50 mg via ORAL
  Filled 2021-09-20 (×2): qty 1

## 2021-09-20 MED ORDER — LACTATED RINGERS IV SOLN: INTRAVENOUS | Status: AC

## 2021-09-20 MED ORDER — ACETAMINOPHEN 325 MG PO TABS
650.0000 mg | ORAL_TABLET | Freq: Four times a day (QID) | ORAL | Status: DC | PRN
  Administered 2021-09-20 – 2021-09-21 (×3): 650 mg via ORAL
  Filled 2021-09-20 (×3): qty 2

## 2021-09-20 MED ORDER — LACTATED RINGERS IV BOLUS
1000.0000 mL | Freq: Once | INTRAVENOUS | Status: AC
Start: 2021-09-20 — End: 2021-09-20
  Administered 2021-09-20: 1000 mL via INTRAVENOUS

## 2021-09-20 MED ORDER — OXYCODONE-ACETAMINOPHEN 5-325 MG PO TABS
1.0000 | ORAL_TABLET | Freq: Once | ORAL | Status: AC
  Administered 2021-09-20: 1 via ORAL
  Filled 2021-09-20: qty 1

## 2021-09-20 MED ORDER — ESCITALOPRAM OXALATE 10 MG PO TABS
10.0000 mg | ORAL_TABLET | Freq: Every day | ORAL | Status: DC
  Administered 2021-09-20 – 2021-09-22 (×3): 10 mg via ORAL
  Filled 2021-09-20 (×3): qty 1

## 2021-09-20 NOTE — Assessment & Plan Note (Addendum)
Sepsis due to acute pyelonephritis in the setting of recent procedure with ureteral stent placement.  ?-IV Rocephin-- changed to vantin since culture back ?-Blood culture NGTD ?-abx x 2 week ?

## 2021-09-20 NOTE — Assessment & Plan Note (Addendum)
S/p stent placement ?-flomax added ?-Follow-up with urology for stent removal ?

## 2021-09-20 NOTE — Consult Note (Signed)
?  ? ?Urology Consult  ? ?I have been asked to see the patient by Dr. Katrinka Blazing, for evaluation and management of UTI/pyelonephritis. ? ?Chief Complaint: Fever, left flank pain, hematuria ? ?HPI:  ?Vanessa Hester is a 26 y.o. 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  Stent remains in place, and is due to be removed on 10/15/2021. ? ?She presents with a few days of subjective fever at home, gross hematuria, left-sided flank pain and lower abdominal pain, and urinary urgency.   ? ?Work-up in ED included lab work and CT, with labs notable for grossly infected urinalysis, leukocytosis to 17k, normal renal function, normal lactate.  CT showed stent in appropriate position with no residual stones, no evidence of abscess, hydronephrosis, or urine leak. ? 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 ? ?PMH: ?Past Medical History:  ?Diagnosis Date  ? Anemia   ? Anxiety   ? Depression   ? Febrile seizure (HCC) 10/10/2014  ? When she was an infant  ? History of kidney stones   ? Ovarian cyst   ? 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 ? ?Surgical History: ?Past Surgical History:  ?Procedure Laterality Date  ? KNEE SURGERY Left   ? ROBOT ASSISTED PYELOPLASTY Left 08/24/2021  ? Procedure: XI ROBOTIC ASSISTED PYELOPLASTY WITH STENT PLACEMENT;  Surgeon: Sondra Come, MD;  Location: [facility] ORS;  Service: Urology;  Laterality: Left;  ? TUMOR REMOVAL  10/10/14  ? 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   ? ? ?Allergies:  ?Allergies  ?Allergen Reactions  ? Other Shortness Of Breath  ?  Medication is "Durahistamine" used for coughing when she was a child  ? Benadryl [Diphenhydramine] Hives  ? Clindamycin/Lincomycin Nausea And Vomiting  ?  Scratchy throat  ? Gardasil 9 [Hpv 9-Valent Recomb Vaccine]   ?  Unknown reaction  ? Amoxicillin Hives  ? Latex Rash  ? ? ?Family History: ?Family History  ?Problem Relation Age of Onset  ?  Migraines Father   ? Hypertension Father   ? Cancer Maternal Grandmother 68  ?     colon and uterine  ? Cancer Maternal Grandfather   ?     prostate  ? Arthritis Paternal Grandmother   ? Diabetes Paternal Grandmother   ? Asthma Paternal Grandfather   ? Depression Mother   ? Cancer Maternal Aunt 40  ?     breast  ? Diabetes Maternal Uncle   ? Mental illness Paternal Aunt   ? ? ?Social History:  reports that she quit smoking about 4 years ago. Her smoking use included e-cigarettes and cigarettes. She has never used smokeless tobacco. She reports current alcohol use. She reports current drug use. Drugs: Marijuana and MDMA (Ecstacy). ? ?ROS: ?Negative aside from those stated in the HPI. ? ?Physical Exam: ?BP (!) 110/58   Pulse 98   Temp 99.1 ?F (37.3 ?C) (Oral)   Resp 12   Wt 108 kg   LMP 08/17/2021 (Approximate) Comment: neg preg test today  SpO2 96%   BMI 39.61 kg/m?   ? ?Constitutional:  Alert and oriented, No acute distress. ?Cardiovascular: No clubbing, cyanosis, or edema. ?Respiratory: Normal respiratory effort, no increased work of breathing. ?GI: Abdomen is soft, nontender, nondistended, mild left lower quadrant tenderness ?GU: Left CVA tenderness ?Lymph: No cervical or inguinal lymphadenopathy. ?Skin: No rashes, bruises or suspicious lesions. ?Neurologic: Grossly intact, no focal  deficits, moving all 4 extremities. ?Psychiatric: Normal mood and affect. ? ?Laboratory Data: ?Reviewed in epic, see HPI ? ?Pertinent Imaging: ?I have personally reviewed the CT showing a stent in appropriate position, perinephric standing consistent with pyelonephritis, no evidence of hydronephrosis, abscess, or urine leak. ? ?Assessment & Plan:   ?26 year old female with history of left UPJ obstruction and multiple left renal stones status post left robotic pyeloplasty and stone removal on 08/24/2021 who presents with fevers, left-sided flank pain, and clinical picture consistent with UTI/sepsis from urinary source.  CT with no  evidence of hydronephrosis, urine leak, or abscess. ? ?Recommendations: ?-Recommend 2-week course of antibiotics, would start with ceftriaxone based on prior culture data, and transition to potentially cefpodoxime pending culture data ?-No indication for Foley catheter or other urologic intervention ?-Keep follow-up as scheduled on 10/15/2021 for stent removal ? ?Sondra Come, MD ? ? ? ?[facility] ?[address], [address] ?[place], [place] [address] ?[phone] ? ?

## 2021-09-20 NOTE — Progress Notes (Signed)
CODE SEPSIS - PHARMACY COMMUNICATION ? ?**Broad Spectrum Antibiotics should be administered within 1 hour of Sepsis diagnosis** ? ?Time Code Sepsis Called/Page Received: 0911 ? ?Antibiotics Ordered: ceftriaxone ? ?Time of 1st antibiotic administration: 0911 ? ?Additional action taken by pharmacy: None ? ?If necessary, Name of Provider/Nurse Contacted: n/a ? ? ? ?Jaynie Bream, PharmD ?Pharmacy Resident  ?09/20/2021 ?9:23 AM ? ? ?

## 2021-09-20 NOTE — Assessment & Plan Note (Addendum)
Estimated body mass index is 39.62 kg/m? as calculated from the following: ?  Height as of this encounter: 5\' 5"  (1.651 m). ?  Weight as of this encounter: 108 kg. ? ?

## 2021-09-20 NOTE — Progress Notes (Signed)
[facility] following for sepsis protocol. 

## 2021-09-20 NOTE — Assessment & Plan Note (Signed)
-   Continue home medications 

## 2021-09-20 NOTE — ED Triage Notes (Signed)
Pt comes pov with lower abd pain and hematuria for 2 days. Had stent placed left kidney April 10th for infection/stones. Pain meds and spasm medication not working.  ?

## 2021-09-20 NOTE — Assessment & Plan Note (Addendum)
?   Hematuria vs dilutional ?-trend ?

## 2021-09-20 NOTE — ED Provider Notes (Signed)
? ?[facility] ?Provider Note ? ? ? Event Date/Time  ? First MD Initiated Contact with Patient 09/20/21 [id number]   ?  (approximate) ? ? ?History  ? ?Hematuria ? ? ?HPI ? ?Vanessa Hester is a 26 y.o. female with a past medical history of anemia, anxiety, depression, ovarian cyst, syphilis and kidney stones as well as left-sided pyelonephritis and hydronephrosis s/p L pyeloplasty w/ stent placement and stone removal 4/10 who presents for evaluation of nausea, subjective fevers and left lower quadrant abdominal pain associate with gross hematuria.  Patient states started feeling a little bit bad yesterday but felt more severe pain today.  She has not had any back pain, vomiting, chest pain, cough, shortness of breath, right abdominal pain, headache, earache, sore throat or extremity pain.  She states she is not currently taking any opioid analgesia and is been a while since she has been on antibiotics.  No recent EtOH use or illicit drug use.  No other acute concerns at this time. ? ? ?Past Surgical History:  ?Procedure Laterality Date  ? KNEE SURGERY Left   ? ROBOT ASSISTED PYELOPLASTY Left 08/24/2021  ? Procedure: XI ROBOTIC ASSISTED PYELOPLASTY WITH STENT PLACEMENT;  Surgeon: Sondra ComeSninsky, Brian C, MD;  Location: [facility] ORS;  Service: Urology;  Laterality: Left;  ? TUMOR REMOVAL  10/10/14  ? 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   ? ? ?  ?Past Medical History:  ?Diagnosis Date  ? Anemia   ? Anxiety   ? Depression   ? Febrile seizure (HCC) 10/10/2014  ? When she was an infant  ? History of kidney stones   ? Ovarian cyst   ? 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 ? ? ?Physical Exam  ?Triage Vital Signs: ?ED Triage Vitals  ?Enc Vitals Group  ?   BP 09/20/21 0722 126/68  ?   Pulse Rate 09/20/21 0722 (!) 136  ?   Resp 09/20/21 0722 18  ?   Temp 09/20/21 0722 99.1 ?F (37.3 ?C)  ?   Temp Source 09/20/21 0722 Oral  ?   SpO2 09/20/21 0722 99 %  ?   Weight 09/20/21 0723 238 lb (108 kg)  ?   Height --   ?   Head Circumference --   ?    Peak Flow --   ?   Pain Score 09/20/21 0723 10  ?   Pain Loc --   ?   Pain Edu? --   ?   Excl. in GC? --   ? ? ?Most recent vital signs: ?Vitals:  ? 09/20/21 0911 09/20/21 0930  ?BP:  (!) 110/58  ?Pulse: (!) 106 98  ?Resp: (!) 21 12  ?Temp:    ?SpO2: 97% 96%  ? ? ?General: Awake, seems uncomfortable. ?CV:  Long capillary fill in the digits.  2+ radial pulse. ?Resp:  Normal effort.  ?Abd:  No distention.  Tender in the left lower quadrant.  No bleeding or drainage from laparoscopic incision sites.  No CVA tenderness. ?Other:  Mucous membranes ? ? ?ED Results / Procedures / Treatments  ?Labs ?(all labs ordered are listed, but only abnormal results are displayed) ?Labs Reviewed  ?CBC - Abnormal; Notable for the following components:  ?    Result Value  ? WBC 17.0 (*)   ? Hemoglobin 11.7 (*)   ? All other components within normal limits  ?BASIC METABOLIC PANEL - Abnormal; Notable for the following components:  ? Glucose, Bld 111 (*)   ? Creatinine, Ser  1.01 (*)   ? Calcium 8.8 (*)   ? All other components within normal limits  ?URINALYSIS, ROUTINE W REFLEX MICROSCOPIC - Abnormal; Notable for the following components:  ? Color, Urine YELLOW (*)   ? APPearance CLOUDY (*)   ? Hgb urine dipstick SMALL (*)   ? Protein, ur 100 (*)   ? Nitrite POSITIVE (*)   ? Leukocytes,Ua LARGE (*)   ? RBC / HPF >50 (*)   ? WBC, UA >50 (*)   ? Bacteria, UA MANY (*)   ? All other components within normal limits  ?CULTURE, BLOOD (ROUTINE X 2)  ?CULTURE, BLOOD (ROUTINE X 2)  ?URINE CULTURE  ?LACTIC ACID, PLASMA  ?PROTIME-INR  ?APTT  ?LACTIC ACID, PLASMA  ?POC URINE PREG, ED  ? ? ? ?EKG ? ?RADIOLOGY ? ? ?CT abdomen pelvis on my review shows double-J stent in place with the pigtail in the bladder.  I do not see any severe hydronephrosis or diverticulitis.  I reviewed radiology interpretation and agree with their findings of improved with mild residual dilation of the left intrarenal collecting system without any stones visualized and interval  worsening of periaortic adenopathy and a 1.3 cm left renal cyst without other acute abdominal or pelvic process. ? ?PROCEDURES: ? ?Critical Care performed: Yes, see critical care procedure note(s) ? ?.Critical Care ?Performed by: Gilles Chiquito, MD ?Authorized by: Gilles Chiquito, MD  ? ?Critical care provider statement:  ?  Critical care time (minutes):  30 ?  Critical care was necessary to treat or prevent imminent or life-threatening deterioration of the following conditions:  Sepsis ?  Critical care was time spent personally by me on the following activities:  Development of treatment plan with patient or surrogate, discussions with consultants, evaluation of patient's response to treatment, examination of patient, ordering and review of laboratory studies, ordering and review of radiographic studies, ordering and performing treatments and interventions, pulse oximetry, re-evaluation of patient's condition and review of old charts ? ? ?MEDICATIONS ORDERED IN ED: ?Medications  ?lactated ringers infusion (has no administration in time range)  ?lactated ringers bolus 1,000 mL (has no administration in time range)  ?ondansetron (ZOFRAN-ODT) disintegrating tablet 4 mg (4 mg Oral Given 09/20/21 0732)  ?oxyCODONE-acetaminophen (PERCOCET/ROXICET) 5-325 MG per tablet 1 tablet (1 tablet Oral Given 09/20/21 0732)  ?lactated ringers bolus 1,000 mL (1,000 mLs Intravenous New Bag/Given 09/20/21 0900)  ?morphine (PF) 4 MG/ML injection 4 mg (4 mg Intravenous Given 09/20/21 0859)  ?cefTRIAXone (ROCEPHIN) 1 g in sodium chloride 0.9 % 100 mL IVPB (0 g Intravenous Stopped 09/20/21 0939)  ?iohexol (OMNIPAQUE) 300 MG/ML solution 100 mL (100 mLs Intravenous Contrast Given 09/20/21 0937)  ? ? ? ?IMPRESSION / MDM / ASSESSMENT AND PLAN / ED COURSE  ?I reviewed the triage vital signs and the nursing notes. ?             ?               ? ?Differential diagnosis includes, but is not limited to pyelonephritis, cystitis, recurrent stone or  complication from stent as well as sepsis dehydration electrolyte and metabolic derangements. ? ?BMP shows no significant electrolyte or metabolic derangements.  Pregnancy test is negative.  CBC shows WBC count of 17 and hemoglobin of 11.7 with normal platelets.  UA appears infected with positive nitrites, protein and large leukocyte esterase with green 50 RBCs and WBCs with many bacteria noted.  Initial lactic acid 1.9.  Coagulation studies unremarkable. ? ? ?  CT abdomen pelvis on my review shows double-J stent in place with the pigtail in the bladder.  I do not see any severe hydronephrosis or diverticulitis.  I reviewed radiology interpretation and agree with their findings of improved with mild residual dilation of the left intrarenal collecting system without any stones visualized and interval worsening of periaortic adenopathy and a 1.3 cm left renal cyst without other acute abdominal or pelvic process. ? ?I suspect symptoms are likely related to sepsis from recurrent cystitis.  Patient does not have any perinephric stranding or back pain at this time to suggest nephritis.  I discussed patient with on-call urologist Dr. Signa Kell who reviewed CT and saw patient emergency room and felt that no intervention surgically was necessary at this point and recommended IV antibiotics and fluids.  I will admit to medicine service for further evaluation and management. ?  ? ? ?FINAL CLINICAL IMPRESSION(S) / ED DIAGNOSES  ? ?Final diagnoses:  ?Sepsis, due to unspecified organism, unspecified whether acute organ dysfunction present [facility]  ?Acute cystitis with hematuria  ? ? ? ?Rx / DC Orders  ? ?ED Discharge Orders   ? ? None  ? ?  ? ? ? ?Note:  This document was prepared using Dragon voice recognition software and may include unintentional dictation errors. ?  ?Gilles Chiquito, MD ?09/20/21 1046 ? ?

## 2021-09-20 NOTE — Assessment & Plan Note (Signed)
See above

## 2021-09-20 NOTE — H&P (Signed)
?History and Physical  ? ? ?Vanessa Hester KWI:[id number] DOB: Dec 18, 1995 DOA: 09/20/2021 ? ?Referring MD/NP/PA:  ? ?PCP: Pcp, No  ? ?Patient coming from:  The patient is coming from home.  At baseline, pt is independent for most of ADL.       ? ?Chief Complaint: Hematuria, dysuria, lower abdominal pain, fever, chills ? ?HPI: Vanessa Hester is a 26 y.o. female with medical history significant of hydronephrosis due to obstructive kidney stone, anemia, ovarian cyst, syphilis, obesity with a BMI 39.61, IBS, PID, who presents with hematuria, lower abdominal pain, fever and chills. ? ?Patient has history of left UPJ obstruction and multiple left-sided stones. She is s/p of left-sided robotic pyeloplasty and stone removal with stent placement on 08/24/2021.  Stent is due to be removed on 10/15/2021.  Patient states that she developed hematuria, fever, chills and lower abdominal pain in the past 2 days.  The abdominal pain is constant, moderate, aching, nonradiating.  She has nausea, no vomiting, diarrhea.  Denies chest pain, cough, shortness of breath.  She also reports dysuria and increased urinary frequency. ? ?Data Reviewed and ED Course: pt was found to have WBC 17.0, positive urinalysis (cloudy appearance, large amount of leukocyte, positive nitrite, many bacteria, WBC> 50), negative pregnancy test, lactic acid 1.9, INR 1.2, PTT 33, GFR> 60, temperature 99.1, blood pressure 110/58, heart rate 136, RR 24, oxygen saturation 99% on room air.  Patient is admitted to MedSurg bed as inpatient.  Dr. Richardo Hanks of urology is consulted. ? ?CT-abd/pelvis ?1. Left-sided double-J internal ureteral stent in place with pigtail ?over the left renal pelvis and bladder. Improved, but mild residual ?dilatation of the left intrarenal collecting system. No current ?renal or ureteral stones visualized. ?2. Interval worsening of periaortic adenopathy with largest node ?over the left periaortic region at the level of the left renal  hilum ?measuring 2 cm. Findings are likely reactive as recommend attention ?on follow-up. ?3. 1.3 cm left renal cyst. ? ?EKG: Not done in ED ? ? ?Review of Systems:  ? ?General: has fevers, chills, no body weight gain, has fatigue ?HEENT: no blurry vision, hearing changes or sore throat ?Respiratory: no dyspnea, coughing, wheezing ?CV: no chest pain, no palpitations ?GI: has nausea, no vomiting, abdominal pain, diarrhea, constipation ?GU: has dysuria, increased urinary frequency, hematuria  ?Ext: no leg edema ?Neuro: no unilateral weakness, numbness, or tingling, no vision change or hearing loss ?Skin: no rash, no skin tear. ?MSK: No muscle spasm, no deformity, no limitation of range of movement in spin ?Heme: No easy bruising.  ?Travel history: No recent long distant travel. ? ? ?Allergy:  ?Allergies  ?Allergen Reactions  ? Other Shortness Of Breath  ?  Medication is "Durahistamine" used for coughing when she was a child  ? Benadryl [Diphenhydramine] Hives  ? Clindamycin/Lincomycin Nausea And Vomiting  ?  Scratchy throat  ? Gardasil 9 [Hpv 9-Valent Recomb Vaccine]   ?  Unknown reaction  ? Amoxicillin Hives  ? Latex Rash  ? ? ?Past Medical History:  ?Diagnosis Date  ? Anemia   ? Anxiety   ? Depression   ? Febrile seizure (HCC) 10/10/2014  ? When she was an infant  ? History of kidney stones   ? Ovarian cyst   ? 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 ? ?Past Surgical History:  ?Procedure Laterality Date  ? KNEE SURGERY Left   ? ROBOT ASSISTED PYELOPLASTY Left 08/24/2021  ? Procedure: XI ROBOTIC ASSISTED PYELOPLASTY WITH STENT PLACEMENT;  Surgeon: Legrand Rams  C, MD;  Location: [facility] ORS;  Service: Urology;  Laterality: Left;  ? TUMOR REMOVAL  10/10/14  ? 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   ? ? ?Social History:  reports that she quit smoking about 4 years ago. Her smoking use included e-cigarettes and cigarettes. She has never used smokeless tobacco. She reports current alcohol use. She reports current drug use. Drugs: Marijuana and MDMA  (Ecstacy). ? ?Family History:  ?Family History  ?Problem Relation Age of Onset  ? Migraines Father   ? Hypertension Father   ? Cancer Maternal Grandmother 1  ?     colon and uterine  ? Cancer Maternal Grandfather   ?     prostate  ? Arthritis Paternal Grandmother   ? Diabetes Paternal Grandmother   ? Asthma Paternal Grandfather   ? Depression Mother   ? Cancer Maternal Aunt 65  ?     breast  ? Diabetes Maternal Uncle   ? Mental illness Paternal Aunt   ?  ? ?Prior to Admission medications   ?Medication Sig Start Date End Date Taking? Authorizing Provider  ?acetaminophen (TYLENOL) 325 MG tablet Take 650 mg by mouth every 6 (six) hours as needed for mild pain.    [provider]  ?docusate sodium (COLACE) 100 MG capsule Take 1 capsule (100 mg total) by mouth 2 (two) times daily. 08/25/21   Debroah Loop, PA-C  ?doxycycline (VIBRAMYCIN) 100 MG capsule Take 1 capsule (100 mg total) by mouth 2 (two) times daily. 09/04/21   Billey Co, MD  ?ergocalciferol (VITAMIN D2) 1.25 MG (50000 UT) capsule Take by mouth. 08/17/21 08/17/22  [provider]  ?escitalopram (LEXAPRO) 10 MG tablet Take 10 mg by mouth daily. 08/05/21   [provider]  ?ketorolac (TORADOL) 10 MG tablet Take 10 mg by mouth every 6 (six) hours as needed for pain. 08/05/21   [provider]  ?ondansetron (ZOFRAN-ODT) 4 MG disintegrating tablet Take 1 tablet (4 mg total) by mouth every 8 (eight) hours as needed for nausea or vomiting. 08/27/21   Billey Co, MD  ?oxybutynin (DITROPAN-XL) 10 MG 24 hr tablet Take 1 tablet (10 mg total) by mouth daily. 08/25/21   Debroah Loop, PA-C  ?oxyCODONE (OXY IR/ROXICODONE) 5 MG immediate release tablet Take 1-2 tablets (5-10 mg total) by mouth every 6 (six) hours as needed for moderate pain. 08/25/21   Debroah Loop, PA-C  ?traZODone (DESYREL) 50 MG tablet Take 1 tablet (50 mg total) by mouth at bedtime. 05/30/21   Danford, Suann Larry, MD  ? ? ?Physical  Exam: ?Vitals:  ? 09/20/21 1130 09/20/21 1200 09/20/21 1227 09/20/21 1600  ?BP: (!) 109/55 (!) 112/52 121/78 111/62  ?Pulse: 81 89 86 77  ?Resp: 12 19 20 20   ?Temp: 99 ?F (37.2 ?C)  98.9 ?F (37.2 ?C) 98.2 ?F (36.8 ?C)  ?TempSrc: Oral  Oral Oral  ?SpO2: 100% 100% 100% 98%  ?Weight:   108 kg   ?Height:   5\' 5"  (1.651 m)   ? ?General: Not in acute distress ?HEENT: ?      Eyes: PERRL, EOMI, no scleral icterus. ?      ENT: No discharge from the ears and nose, no pharynx injection, no tonsillar enlargement.  ?      Neck: No JVD, no bruit, no mass felt. ?Heme: No neck lymph node enlargement. ?Cardiac: S1/S2, RRR, No murmurs, No gallops or rubs. ?Respiratory: No rales, wheezing, rhonchi or rubs. ?GI: Soft, nondistended, nontender, no rebound pain, no  organomegaly, BS present. ?GU: No hematuria ?Ext: No pitting leg edema bilaterally. 1+DP/PT pulse bilaterally. ?Musculoskeletal: No joint deformities, No joint redness or warmth, no limitation of ROM in spin. ?Skin: No rashes.  ?Neuro: Alert, oriented X3, cranial nerves II-XII grossly intact, moves all extremities normally.  ?Psych: Patient is not psychotic, no suicidal or hemocidal ideation. ? ?Labs on Admission: I have personally reviewed following labs and imaging studies ? ?CBC: ?Recent Labs  ?Lab 09/20/21 ?0726  ?WBC 17.0*  ?HGB 11.7*  ?HCT 37.9  ?MCV 86.9  ?PLT 358  ? ?Basic Metabolic Panel: ?Recent Labs  ?Lab 09/20/21 ?0726  ?NA 135  ?K 3.7  ?CL 102  ?CO2 24  ?GLUCOSE 111*  ?BUN 10  ?CREATININE 1.01*  ?CALCIUM 8.8*  ? ?GFR: ?Estimated Creatinine Clearance: 104 mL/min (A) (by C-G formula based on SCr of 1.01 mg/dL (H)). ?Liver Function Tests: ?No results for input(s): AST, ALT, ALKPHOS, BILITOT, PROT, ALBUMIN in the last 168 hours. ?No results for input(s): LIPASE, AMYLASE in the last 168 hours. ?No results for input(s): AMMONIA in the last 168 hours. ?Coagulation Profile: ?Recent Labs  ?Lab 09/20/21 ?0847  ?INR 1.2  ? ?Cardiac Enzymes: ?No results for input(s): CKTOTAL,  CKMB, CKMBINDEX, TROPONINI in the last 168 hours. ?BNP (last 3 results) ?No results for input(s): PROBNP in the last 8760 hours. ?HbA1C: ?No results for input(s): HGBA1C in the last 72 hours. ?CBG: ?No results fo

## 2021-09-21 DIAGNOSIS — N390 Urinary tract infection, site not specified: Secondary | ICD-10-CM

## 2021-09-21 DIAGNOSIS — A419 Sepsis, unspecified organism: Secondary | ICD-10-CM | POA: Diagnosis not present

## 2021-09-21 DIAGNOSIS — N10 Acute tubulo-interstitial nephritis: Secondary | ICD-10-CM | POA: Diagnosis not present

## 2021-09-21 LAB — CBC
HCT: 31.7 % — ABNORMAL LOW (ref 36.0–46.0)
Hemoglobin: 9.9 g/dL — ABNORMAL LOW (ref 12.0–15.0)
MCH: 26.9 pg (ref 26.0–34.0)
MCHC: 31.2 g/dL (ref 30.0–36.0)
MCV: 86.1 fL (ref 80.0–100.0)
Platelets: 290 10*3/uL (ref 150–400)
RBC: 3.68 MIL/uL — ABNORMAL LOW (ref 3.87–5.11)
RDW: 13.5 % (ref 11.5–15.5)
WBC: 8.9 10*3/uL (ref 4.0–10.5)
nRBC: 0 % (ref 0.0–0.2)

## 2021-09-21 LAB — BASIC METABOLIC PANEL
Anion gap: 4 — ABNORMAL LOW (ref 5–15)
BUN: 12 mg/dL (ref 6–20)
CO2: 28 mmol/L (ref 22–32)
Calcium: 8.7 mg/dL — ABNORMAL LOW (ref 8.9–10.3)
Chloride: 107 mmol/L (ref 98–111)
Creatinine, Ser: 1.03 mg/dL — ABNORMAL HIGH (ref 0.44–1.00)
GFR, Estimated: >60 mL/min (ref >60)
Glucose, Bld: 90 mg/dL (ref 70–99)
Potassium: 4.2 mmol/L (ref 3.5–5.1)
Sodium: 139 mmol/L (ref 135–145)

## 2021-09-21 LAB — RPR
RPR Ser Ql: REACTIVE — AB
RPR Titer: 1:8 {titer}

## 2021-09-21 MED ORDER — TRIMETHOPRIM 100 MG PO TABS: 100.0000 mg | ORAL_TABLET | Freq: Two times a day (BID) | ORAL | 2 refills | Status: DC

## 2021-09-21 NOTE — Progress Notes (Addendum)
Urology Inpatient Progress Note ? ?Subjective: ?No acute events overnight.  She is afebrile and hypotensive this morning.  HR is normal. ?WBC count down today, 8.9.  Creatinine stable, 1.03.  Urine culture pending, blood cultures pending with no growth <24 hours.  On antibiotics as below. ?RPR has resulted reactive with a titer of 1:8; previous titer 1:4 on 08/05/2021 (originally >1:1024 on 12/13/2020).  Treponemal antibodies pending.  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 ?She reports some persistent left-sided abdominal pain and dysuria that she attributes to her ureteral stent.  She states overall she feels better today. ? ?Anti-infectives: ?Anti-infectives (From admission, onward)  ? ? Start     Dose/Rate Route Frequency Ordered Stop  ? 09/21/21 1100  cefTRIAXone (ROCEPHIN) 2 g in sodium chloride 0.9 % 100 mL IVPB       ? 2 g ?200 mL/hr over 30 Minutes Intravenous Every 24 hours 09/20/21 1113    ? 09/20/21 1115  cefTRIAXone (ROCEPHIN) 1 g in sodium chloride 0.9 % 100 mL IVPB       ? 1 g ?200 mL/hr over 30 Minutes Intravenous  Once 09/20/21 1113 09/20/21 1254  ? 09/20/21 0845  cefTRIAXone (ROCEPHIN) 1 g in sodium chloride 0.9 % 100 mL IVPB       ? 1 g ?200 mL/hr over 30 Minutes Intravenous  Once 09/20/21 0843 09/20/21 0939  ? ?  ? ? ?Current Facility-Administered Medications  ?Medication Dose Route Frequency Provider Last Rate Last Admin  ? acetaminophen (TYLENOL) tablet 650 mg  650 mg Oral Q6H PRN Lorretta Harp, MD   650 mg at 09/20/21 2204  ? cefTRIAXone (ROCEPHIN) 2 g in sodium chloride 0.9 % 100 mL IVPB  2 g Intravenous Q24H Lorretta Harp, MD      ? docusate sodium (COLACE) capsule 100 mg  100 mg Oral Daily PRN Lorretta Harp, MD      ? escitalopram (LEXAPRO) tablet 10 mg  10 mg Oral Daily Lorretta Harp, MD   10 mg at 09/20/21 1802  ? morphine (PF) 2 MG/ML injection 2 mg  2 mg Intravenous Q4H PRN Lorretta Harp, MD   2 mg at 09/20/21 2204  ?  ondansetron (ZOFRAN) injection 4 mg  4 mg Intravenous Q8H PRN Lorretta Harp, MD   4 mg at 09/20/21 1919  ? oxybutynin (DITROPAN-XL) 24 hr tablet 10 mg  10 mg Oral Daily Lorretta Harp, MD   10 mg at 09/20/21 1802  ? oxyCODONE (Oxy IR/ROXICODONE) immediate release tablet 5-10 mg  5-10 mg Oral Q6H PRN Lorretta Harp, MD   10 mg at 09/21/21 0754  ? traZODone (DESYREL) tablet 50 mg  50 mg Oral QHS Lorretta Harp, MD   50 mg at 09/20/21 2200  ? ?Objective: ?Vital signs in last 24 hours: ?Temp:  [97.6 ?F (36.4 ?C)-99 ?F (37.2 ?C)] 97.7 ?F (36.5 ?C) (05/08 1287) ?Pulse Rate:  [68-116] 76 (05/08 0743) ?Resp:  [12-24] 16 (05/08 0743) ?BP: (92-121)/(50-78) 95/50 (05/08 0743) ?SpO2:  [95 %-100 %] 98 % (05/08 0743) ?Weight:  [867 kg] 108 kg (05/07 1227) ? ?Intake/Output from previous day: ?05/07 0701 - 05/08 0700 ?In: 2971.6 [P.O.:1200; I.V.:771.6; IV Piggyback:1000] ?Out: 1850 [Urine:1850] ?Intake/Output this shift: ?Total I/O ?In: -  ?Out: 450 [Urine:450] ? ?Physical Exam ?Vitals and nursing note reviewed.  ?Constitutional:   ?   General: She is not in acute distress. ?   Appearance: She is not  ill-appearing, toxic-appearing or diaphoretic.  ?HENT:  ?   Head: Normocephalic and atraumatic.  ?Pulmonary:  ?   Effort: Pulmonary effort is normal. No respiratory distress.  ?Skin: ?   General: Skin is warm and dry.  ?Neurological:  ?   Mental Status: She is alert and oriented to person, place, and time.  ?Psychiatric:     ?   Mood and Affect: Mood normal.     ?   Behavior: Behavior normal.  ? ? ?Lab Results:  ?Recent Labs  ?  09/20/21 ?0726 09/21/21 ?[id number]  ?WBC 17.0* 8.9  ?HGB 11.7* 9.9*  ?HCT 37.9 31.7*  ?PLT 358 290  ? ?BMET ?Recent Labs  ?  09/20/21 ?0726 09/21/21 ?[id number]  ?NA 135 139  ?K 3.7 4.2  ?CL 102 107  ?CO2 24 28  ?GLUCOSE 111* 90  ?BUN 10 12  ?CREATININE 1.01* 1.03*  ?CALCIUM 8.8* 8.7*  ? ?PT/INR ?Recent Labs  ?  09/20/21 ?[id number]  ?LABPROT 14.9  ?INR 1.2  ? ?Studies/Results: ?CT ABDOMEN PELVIS W CONTRAST ? ?Result Date: 09/20/2021 ?CLINICAL  DATA:  Sepsis. Lower abdominal pain and hematuria 2 days. Left renal stent placed 08/24/2021. EXAM: CT ABDOMEN AND PELVIS WITH CONTRAST TECHNIQUE: Multidetector CT imaging of the abdomen and pelvis was performed using the standard protocol following bolus administration of intravenous contrast. RADIATION DOSE REDUCTION: This exam was performed according to the departmental dose-optimization program which includes automated exposure control, adjustment of the mA and/or kV according to patient size and/or use of iterative reconstruction technique. CONTRAST:  100mL OMNIPAQUE IOHEXOL 300 MG/ML  SOLN COMPARISON:  05/28/2021, 06/27/2016 FINDINGS: Lower chest: Lung bases are clear. Hepatobiliary: Liver, gallbladder and biliary tree are normal. Pancreas: Normal. Spleen: Normal. Adrenals/Urinary Tract: Adrenal glands are normal. Kidneys are normal size. Right renal hilum oriented anteriorly. Stable 1.3 cm left renal cyst. Improved, but mild residual dilatation of the left intrarenal collecting system. Double-J left-sided internal ureteral stent is in place with pigtail over the left renal pelvis and bladder. No current left renal stones. Mild residual left perinephric fluid. Mild delay corticomedullary uptake and excretion of contrast by the left kidney. No current renal stones noted. Ureters and bladder are unremarkable. Stomach/Bowel: Stomach and small bowel are normal. Appendix is normal. Colon is normal. Vascular/Lymphatic: Abdominal aorta is normal in caliber. Remaining vascular structures are unremarkable. Interval worsening of periaortic adenopathy with largest node over the left periaortic region at the level of the left renal hilum measuring 2 cm by short axis (previously 1.2 cm). Findings likely reactive. Reproductive: Normal. Other: None. Musculoskeletal: No focal abnormality. IMPRESSION: 1. Left-sided double-J internal ureteral stent in place with pigtail over the left renal pelvis and bladder. Improved, but mild  residual dilatation of the left intrarenal collecting system. No current renal or ureteral stones visualized. 2. Interval worsening of periaortic adenopathy with largest node over the left periaortic region at the level of the left renal hilum measuring 2 cm. Findings are likely reactive as recommend attention on follow-up. 3. 1.3 cm left renal cyst. Electronically Signed   By: Elberta Fortisaniel  Boyle M.D.   On: 09/20/2021 10:26   ? 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  Patient clinically improving on empiric antibiotics with some residual stent discomfort. ? ?RPR and titers continue to show adequate response to prior syphilis treatment. ? ?Recommendations: ?-Continue empiric antibiotics and follow urine cultures for total of 2 weeks of culture appropriate therapy ?-Start  suppressive trimethoprim 100mg  daily after completion of 2 weeks of antibiotics ?-Start Flomax 0.4mg  daily and continue oxybutynin XL 10mg  daily for management of stent discomfort ?-Keep outpatient follow-up for stent removal ? ? , PA-C ?09/21/2021  ?

## 2021-09-21 NOTE — Hospital Course (Signed)
Vanessa Hester is a 26 y.o. female with medical history significant of hydronephrosis due to obstructive kidney stone, anemia, ovarian cyst, syphilis, obesity with a BMI 39.61, IBS, PID, who presents with hematuria, lower abdominal pain, fever and chills. ?  ?Patient has history of left UPJ obstruction and multiple left-sided stones. She is s/p of left-sided robotic pyeloplasty and stone removal with stent placement on 08/24/2021.  Stent is due to be removed on 10/15/2021.  ?Once culture back the plan is for 2 weeks of abx PO if able. ?

## 2021-09-21 NOTE — Progress Notes (Signed)
?PROGRESS NOTE ? ? ? ?[name]  OZH:[id number] DOB: [birth year] DOA: 09/20/2021 ?PCP: Pcp, No  ? ? ?Brief Narrative:  ?Vanessa Hester is a 26 y.o. female with medical history significant of hydronephrosis due to obstructive kidney stone, anemia, ovarian cyst, syphilis, obesity with a BMI 39.61, IBS, PID, who presents with hematuria, lower abdominal pain, fever and chills. ?  ?Patient has history of left UPJ obstruction and multiple left-sided stones. She is s/p of left-sided robotic pyeloplasty and stone removal with stent placement on 08/24/2021.  Stent is due to be removed on 10/15/2021.  ?Once culture back the plan is for 2 weeks of abx PO if able.  ? ? ?Assessment and Plan: ?* Acute pyelonephritis ?Sepsis due to acute pyelonephritis in the setting of recent procedure with ureteral stent placement.  ?-IV Rocephin until urine culture returns ?-Blood culture pending ? ?Sepsis (HCC) ?See above ? ?Hydronephrosis due to obstruction of ureter ?S/p stent placement ?-On antibiotics, see above ?-Follow-up with urology for stent removal ? ?Obesity (BMI 35.0-39.9 without comorbidity) ?Estimated body mass index is 39.62 kg/m? as calculated from the following: ?  Height as of this encounter: 5\' 5"  (1.651 m). ?  Weight as of this encounter: 108 kg. ? ? ?Depression with anxiety ?- Continue home medications ? ?Normocytic anemia ?? Hematuria vs dilutional ?-trend ? ? ? ? ? ? ? ? ? ?DVT prophylaxis: SCDs Start: 09/20/21 1110 ? ?  Code Status: Full Code ? ? ?Disposition Plan:  ?Level of care: Med-Surg ?Status is: Inpatient ?Remains inpatient appropriate because: needs culture back prior to d/c ?  ? ?Consultants:  ?urology ? ? ?Subjective: ?tired ? ?Objective: ?Vitals:  ? 09/20/21 1905 09/20/21 2300 09/21/21 0300 09/21/21 0743  ?BP: (!) 118/56 103/61 92/60 (!) 95/50  ?Pulse: 98 78 68 76  ?Resp: 20 20 18 16   ?Temp: 97.8 ?F (36.6 ?C) 97.6 ?F (36.4 ?C) 97.6 ?F (36.4 ?C) 97.7 ?F (36.5 ?C)  ?TempSrc: Oral Oral Oral Oral  ?SpO2:  98% 99% 99% 98%  ?Weight:      ?Height:      ? ? ?Intake/Output Summary (Last 24 hours) at 09/21/2021 1047 ?Last data filed at 09/21/2021 0755 ?Gross per 24 hour  ?Intake 2971.58 ml  ?Output 2300 ml  ?Net 671.58 ml  ? ?Filed Weights  ? 09/20/21 0723 09/20/21 1227  ?Weight: 108 kg 108 kg  ? ? ?Examination: ? ? ?General: Appearance:    Obese female in no acute distress  ?   ?Lungs:     respirations unlabored  ?Heart:    Normal heart rate.  ?  ?MS:   All extremities are intact.  ?  ?Neurologic:   Awake, alert, oriented x 3. No apparent focal neurological           defect.   ?  ? ? ? ?Data Reviewed: I have personally reviewed following labs and imaging studies ? ?CBC: ?Recent Labs  ?Lab 09/20/21 ?0726 09/21/21 ?11/20/21  ?WBC 17.0* 8.9  ?HGB 11.7* 9.9*  ?HCT 37.9 31.7*  ?MCV 86.9 86.1  ?PLT 358 290  ? ?Basic Metabolic Panel: ?Recent Labs  ?Lab 09/20/21 ?0726 09/21/21 ?11/20/21  ?NA 135 139  ?K 3.7 4.2  ?CL 102 107  ?CO2 24 28  ?GLUCOSE 111* 90  ?BUN 10 12  ?CREATININE 1.01* 1.03*  ?CALCIUM 8.8* 8.7*  ? ?GFR: ?Estimated Creatinine Clearance: 102 mL/min (A) (by C-G formula based on SCr of 1.03 mg/dL (H)). ?Liver Function Tests: ?No results for input(s): AST,  ALT, ALKPHOS, BILITOT, PROT, ALBUMIN in the last 168 hours. ?No results for input(s): LIPASE, AMYLASE in the last 168 hours. ?No results for input(s): AMMONIA in the last 168 hours. ?Coagulation Profile: ?Recent Labs  ?Lab 09/20/21 ?6283  ?INR 1.2  ? ?Cardiac Enzymes: ?No results for input(s): CKTOTAL, CKMB, CKMBINDEX, TROPONINI in the last 168 hours. ?BNP (last 3 results) ?No results for input(s): PROBNP in the last 8760 hours. ?HbA1C: ?No results for input(s): HGBA1C in the last 72 hours. ?CBG: ?No results for input(s): GLUCAP in the last 168 hours. ?Lipid Profile: ?No results for input(s): CHOL, HDL, LDLCALC, TRIG, CHOLHDL, LDLDIRECT in the last 72 hours. ?Thyroid Function Tests: ?No results for input(s): TSH, T4TOTAL, FREET4, T3FREE, THYROIDAB in the last 72 hours. ?Anemia  Panel: ?No results for input(s): VITAMINB12, FOLATE, FERRITIN, TIBC, IRON, RETICCTPCT in the last 72 hours. ?Sepsis Labs: ?Recent Labs  ?Lab 09/20/21 ?0726 09/20/21 ?6629 09/20/21 ?1338  ?PROCALCITON 0.33  --   --   ?LATICACIDVEN  --  1.9 1.1  ? ? ?Recent Results (from the past 240 hour(s))  ?Blood Culture (routine x 2)     Status: None (Preliminary result)  ? Collection Time: 09/20/21  8:48 AM  ? Specimen: BLOOD  ?Result Value Ref Range Status  ? Specimen Description BLOOD LEFT ANTECUBITAL  Final  ? Special Requests   Final  ?  BOTTLES DRAWN AEROBIC AND ANAEROBIC Blood Culture results may not be optimal due to an inadequate volume of blood received in culture bottles  ? Culture   Final  ?  NO GROWTH < 24 HOURS ?Performed at [facility], [address] [place], [place] [address] ?  ? Report Status PENDING  Incomplete  ?Blood Culture (routine x 2)     Status: None (Preliminary result)  ? Collection Time: 09/20/21  8:48 AM  ? Specimen: BLOOD  ?Result Value Ref Range Status  ? Specimen Description BLOOD RIGHT ANTECUBITAL  Final  ? Special Requests   Final  ?  BOTTLES DRAWN AEROBIC AND ANAEROBIC Blood Culture results may not be optimal due to an inadequate volume of blood received in culture bottles  ? Culture   Final  ?  NO GROWTH < 24 HOURS ?Performed at [facility], [address], [place], [place] [address] ?  ? Report Status PENDING  Incomplete  ?  ? ? ? ? ? ?Radiology Studies: ?CT ABDOMEN PELVIS W CONTRAST ? ?Result Date: 09/20/2021 ?CLINICAL DATA:  Sepsis. Lower abdominal pain and hematuria 2 days. Left renal stent placed 08/24/2021. EXAM: CT ABDOMEN AND PELVIS WITH CONTRAST TECHNIQUE: Multidetector CT imaging of the abdomen and pelvis was performed using the standard protocol following bolus administration of intravenous contrast. RADIATION DOSE REDUCTION: This exam was performed according to the departmental dose-optimization program which includes automated exposure control, adjustment of  the mA and/or kV according to patient size and/or use of iterative reconstruction technique. CONTRAST:  OMNIPAQUE IOHEXOL 300 MG/ML  SOLN COMPARISON:  05/28/2021, 06/27/2016 FINDINGS: Lower chest: Lung bases are clear. Hepatobiliary: Liver, gallbladder and biliary tree are normal. Pancreas: Normal. Spleen: Normal. Adrenals/Urinary Tract: Adrenal glands are normal. Kidneys are normal size. Right renal hilum oriented anteriorly. Stable 1.3 cm left renal cyst. Improved, but mild residual dilatation of the left intrarenal collecting system. Double-J left-sided internal ureteral stent is in place with pigtail over the left renal pelvis and bladder. No current left renal stones. Mild residual left perinephric fluid. Mild delay corticomedullary uptake and excretion of contrast by the  left kidney. No current renal stones noted. Ureters and bladder are unremarkable. Stomach/Bowel: Stomach and small bowel are normal. Appendix is normal. Colon is normal. Vascular/Lymphatic: Abdominal aorta is normal in caliber. Remaining vascular structures are unremarkable. Interval worsening of periaortic adenopathy with largest node over the left periaortic region at the level of the left renal hilum measuring 2 cm by short axis (previously 1.2 cm). Findings likely reactive. Reproductive: Normal. Other: None. Musculoskeletal: No focal abnormality. IMPRESSION: 1. Left-sided double-J internal ureteral stent in place with pigtail over the left renal pelvis and bladder. Improved, but mild residual dilatation of the left intrarenal collecting system. No current renal or ureteral stones visualized. 2. Interval worsening of periaortic adenopathy with largest node over the left periaortic region at the level of the left renal hilum measuring 2 cm. Findings are likely reactive as recommend attention on follow-up. 3. 1.3 cm left renal cyst. Electronically Signed   By: Elberta Fortisaniel  Boyle M.D.   On: 09/20/2021 10:26   ? ? ? ? ? ?Scheduled Meds: ?  escitalopram  10 mg Oral Daily  ? oxybutynin  10 mg Oral Daily  ? traZODone  50 mg Oral QHS  ? ?Continuous Infusions: ? cefTRIAXone (ROCEPHIN)  IV 2 g (09/21/21 1042)  ? ? ? LOS: 1 day  ? ? ?Time spent: 4545 mi

## 2021-09-22 DIAGNOSIS — N10 Acute tubulo-interstitial nephritis: Secondary | ICD-10-CM | POA: Diagnosis not present

## 2021-09-22 LAB — CBC
HCT: 33.3 % — ABNORMAL LOW (ref 36.0–46.0)
Hemoglobin: 10.5 g/dL — ABNORMAL LOW (ref 12.0–15.0)
MCH: 26.7 pg (ref 26.0–34.0)
MCHC: 31.5 g/dL (ref 30.0–36.0)
MCV: 84.7 fL (ref 80.0–100.0)
Platelets: 328 10*3/uL (ref 150–400)
RBC: 3.93 MIL/uL (ref 3.87–5.11)
RDW: 13.6 % (ref 11.5–15.5)
WBC: 7.1 10*3/uL (ref 4.0–10.5)
nRBC: 0 % (ref 0.0–0.2)

## 2021-09-22 LAB — URINE CULTURE: Culture: >=100000 — AB

## 2021-09-22 LAB — T.PALLIDUM AB, TOTAL: T Pallidum Abs: REACTIVE — AB

## 2021-09-22 MED ORDER — CEFPODOXIME PROXETIL 200 MG PO TABS: 200.0000 mg | ORAL_TABLET | Freq: Two times a day (BID) | ORAL | 0 refills | Status: DC

## 2021-09-22 MED ORDER — TRIMETHOPRIM 100 MG PO TABS: 100.0000 mg | ORAL_TABLET | Freq: Two times a day (BID) | ORAL | 2 refills | Status: DC

## 2021-09-22 MED ORDER — TAMSULOSIN HCL 0.4 MG PO CAPS: 0.4000 mg | ORAL_CAPSULE | Freq: Every day | ORAL | 0 refills | Status: DC

## 2021-09-22 MED ORDER — OXYCODONE HCL 5 MG PO TABS
5.0000 mg | ORAL_TABLET | Freq: Four times a day (QID) | ORAL | 0 refills | Status: AC | PRN
Start: 2021-09-22 — End: 2021-09-25

## 2021-09-22 NOTE — Discharge Summary (Signed)
?Physician Discharge Summary ?  ?Patient: Vanessa Hester MRN: [id number] DOB: [birth year]  ?Admit date:     09/20/2021  ?Discharge date: 09/22/21  ?Discharge Physician: Joseph Art  ? ?PCP: Pcp, No  ? ?Recommendations at discharge:  ? ? 2 week of vantin followed by trimethoprim for suppression ?Flomax added ?OTC lactobacillus ?Cbc 1 week  ? ?Discharge Diagnoses: ?Principal Problem: ?  Acute pyelonephritis ?Active Problems: ?  Sepsis (HCC) ?  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  Obesity (BMI 35.0-39.9 without comorbidity) ?  Normocytic anemia ?  Depression with anxiety ? ? ? ?Hospital Course: ?Vanessa Hester is a 26 y.o. female with medical history significant of hydronephrosis due to obstructive kidney stone, anemia, ovarian cyst, syphilis, obesity with a BMI 39.61, IBS, PID, who presents with hematuria, lower abdominal pain, fever and chills. ?  ?Patient has history of left UPJ obstruction and multiple left-sided stones. She is s/p of left-sided robotic pyeloplasty and stone removal with stent placement on 08/24/2021.  Stent is due to be removed on 10/15/2021.  ?Once culture back the plan is for 2 weeks of abx PO if able. ? ?Assessment and Plan: ?* Acute pyelonephritis ?Sepsis due to acute pyelonephritis in the setting of recent procedure with ureteral stent placement.  ?-IV Rocephin-- changed to vantin since culture back ?-Blood culture NGTD ?-abx x 2 week ? ?Sepsis (HCC) ?See above ? 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 ?Obesity (BMI 35.0-39.9 without comorbidity) ?Estimated body mass index is 39.62 kg/m? as calculated from the following: ?  Height as of this encounter: 5\' 5"  (1.651 m). ?  Weight as of this encounter: 108 kg. ? ? ?Depression with anxiety ?- Continue home medications ? ?Normocytic anemia ?? Hematuria vs dilutional ?-trend ? ? ? ? ?  ?Consultants: urology ? ?Disposition: Home ?Diet recommendation:   ?Discharge Diet Orders (From admission, onward)  ? ?  Start     Ordered  ? 09/22/21 0000  Diet general       ? 09/22/21 0947  ? ?  ?  ? ?  ? ?Regular diet ?DISCHARGE MEDICATION: ?Allergies as of 09/22/2021   ? ?   Reactions  ? Other Shortness Of Breath  ? Medication is "Durahistamine" used for coughing when she was a child  ? Benadryl [diphenhydramine] Hives  ? Clindamycin/lincomycin Nausea And Vomiting  ? Scratchy throat  ? Gardasil 9 [hpv 9-valent Recomb Vaccine]   ? Unknown reaction  ? Amoxicillin Hives  ? Latex Rash  ? ?  ? ?  ?Medication List  ?  ? ?STOP taking these medications   ? ?doxycycline 100 MG capsule ?Commonly known as: VIBRAMYCIN ?  ?ketorolac 10 MG tablet ?Commonly known as: TORADOL ?  ? ?  ? ?TAKE these medications   ? ?acetaminophen 325 MG tablet ?Commonly known as: TYLENOL ?Take 650 mg by mouth every 6 (six) hours as needed for mild pain. ?  ?cefpodoxime 200 MG tablet ?Commonly known as: VANTIN ?Take 1 tablet (200 mg total) by mouth 2 (two) times daily. After you finish this abx start trimpex ?  ?docusate sodium 100 MG capsule ?Commonly known as: COLACE ?Take 1 capsule (100 mg total) by mouth 2 (two) times daily. ?  ?ergocalciferol 1.25 MG (50000 UT) capsule ?Commonly known as: VITAMIN D2 ?Take by mouth. ?  ?escitalopram 10 MG tablet ?Commonly known as: LEXAPRO ?Take 10 mg by mouth daily. ?  ?ondansetron 4  MG disintegrating tablet ?Commonly known as: ZOFRAN-ODT ?Take 1 tablet (4 mg total) by mouth every 8 (eight) hours as needed for nausea or vomiting. ?  ?oxybutynin 10 MG 24 hr tablet ?Commonly known as: DITROPAN-XL ?Take 1 tablet (10 mg total) by mouth daily. ?  ?oxyCODONE 5 MG immediate release tablet ?Commonly known as: Oxy IR/ROXICODONE ?Take 1-2 tablets (5-10 mg total) by mouth every 6 (six) hours as needed for up to 3 days for moderate pain. ?  ?tamsulosin 0.4 MG Caps capsule ?Commonly known as: Flomax ?Take 1 capsule (0.4 mg total) by mouth daily after supper. ?  ?traZODone 50 MG  tablet ?Commonly known as: DESYREL ?Take 1 tablet (50 mg total) by mouth at bedtime. ?  ?trimethoprim 100 MG tablet ?Commonly known as: TRIMPEX ?Take 1 tablet (100 mg total) by mouth 2 (two) times daily. For UTI prevention; start the day after completing treatment course of antibiotics. ?Start taking on: Oct 05, 2021 ?  ? ?  ? ? ?Discharge Exam: ?Filed Weights  ? 09/20/21 0723 09/20/21 1227  ?Weight: 108 kg 108 kg  ? ? ? ?Condition at discharge: good ? ?The results of significant diagnostics from this hospitalization (including imaging, microbiology, ancillary and laboratory) are listed below for reference.  ? ?Imaging Studies: ?Chest 1 View ? ?Result Date: 08/27/2021 ?CLINICAL DATA:  26 year old female with fever and possible pneumonia EXAM: CHEST  1 VIEW COMPARISON:  01/06/2021 FINDINGS: The heart size and mediastinal contours are within normal limits. Both lungs are clear. The visualized skeletal structures are unremarkable. IMPRESSION: No active disease. Electronically Signed   By: Gilmer MorJaime  Wagner D.O.   On: 08/27/2021 13:24  ? ?Abdomen 1 view (KUB) ? ?Result Date: 08/27/2021 ?CLINICAL DATA:  Interval left ureter stent placement EXAM: ABDOMEN - 1 VIEW COMPARISON:  CT abdomen pelvis dated May 28, 2021 FINDINGS: The bowel gas pattern is normal. Interval left ureter stent placement, proximal stent likely within the left renal pelvis when compared with recent CT, although evaluate somewhat limited since renal shadows are not well visualized. Curls of both proximal and distal stent are well formed. No radio-opaque calculi or other significant radiographic abnormality are seen. Soft tissue gas of the left abdominal wall, likely post postsurgical. IMPRESSION: Interval left ureter stent placement, proximal stent likely within the left renal pelvis when compared with recent CT, although evaluate somewhat limited since renal shadows are not well visualized. Findings discussed 08/27/2021 at 1:38 pm with provider BRIAN  SNINSKY. Electronically Signed   By: Allegra LaiLeah  Strickland M.D.   On: 08/27/2021 13:46  ? ?CT ABDOMEN PELVIS W CONTRAST ? ?Result Date: 09/20/2021 ?CLINICAL DATA:  Sepsis. Lower abdominal pain and hematuria 2 days. Left renal stent placed 08/24/2021. EXAM: CT ABDOMEN AND PELVIS WITH CONTRAST TECHNIQUE: Multidetector CT imaging of the abdomen and pelvis was performed using the standard protocol following bolus administration of intravenous contrast. RADIATION DOSE REDUCTION: This exam was performed according to the departmental dose-optimization program which includes automated exposure control, adjustment of the mA and/or kV according to patient size and/or use of iterative reconstruction technique. CONTRAST:  100mL OMNIPAQUE IOHEXOL 300 MG/ML  SOLN COMPARISON:  05/28/2021, 06/27/2016 FINDINGS: Lower chest: Lung bases are clear. Hepatobiliary: Liver, gallbladder and biliary tree are normal. Pancreas: Normal. Spleen: Normal. Adrenals/Urinary Tract: Adrenal glands are normal. Kidneys are normal size. Right renal hilum oriented anteriorly. Stable 1.3 cm left renal cyst. Improved, but mild residual dilatation of the left intrarenal collecting system. Double-J left-sided internal ureteral stent is in place with pigtail  over the left renal pelvis and bladder. No current left renal stones. Mild residual left perinephric fluid. Mild delay corticomedullary uptake and excretion of contrast by the left kidney. No current renal stones noted. Ureters and bladder are unremarkable. Stomach/Bowel: Stomach and small bowel are normal. Appendix is normal. Colon is normal. Vascular/Lymphatic: Abdominal aorta is normal in caliber. Remaining vascular structures are unremarkable. Interval worsening of periaortic adenopathy with largest node over the left periaortic region at the level of the left renal hilum measuring 2 cm by short axis (previously 1.2 cm). Findings likely reactive. Reproductive: Normal. Other: None. Musculoskeletal: No focal  abnormality. IMPRESSION: 1. Left-sided double-J internal ureteral stent in place with pigtail over the left renal pelvis and bladder. Improved, but mild residual dilatation of the left intrarenal collecting system.

## 2021-09-22 NOTE — Progress Notes (Signed)
Patient discharged, discharge instructions and f/u appt given to patient.  Patient will be transported via auxiliary when transportation arrives.  ?

## 2021-09-23 ENCOUNTER — Telehealth: Payer: Self-pay

## 2021-09-23 ENCOUNTER — Encounter: Payer: Self-pay | Admitting: Internal Medicine

## 2021-09-23 NOTE — Progress Notes (Signed)
This is a no charge note ? ?Pt's syphilis test came back positive. I have tried to call pt by phone, who hang up at first call, then would not pick up the phone severe times. I could not leave message to her phone. I therefore sent her a letter with my recommendations as following: ? ?Dear [name], ? ?This is Dr. Clyde Lundborg. I saw you in hospital recently. As you requested, I did syphilis test for you. I am writing to let you know your test result of this test. It came back positive. I would like to highly recommend you to follow up with infectious disease doctor and get evaluated to decide the best treatment for you. If you already have an infectious disease doctor seeing you in the past, you can follow up with him or her. If you did no have one, you may call our infectious disease doctor for making an appointment. Dr. Lynne Logan office, [phone]. At [address] [place], [place] [address].  ? ? ? ? ? ? ?Lorretta Harp, MD ? ?[facility] ? ? ?If 7PM-7AM, please contact night-coverage ?[email or web address] ?09/23/2021, 7:27 AM ? ?

## 2021-09-23 NOTE — Progress Notes (Deleted)
{  Select_[email or web address]_Note:[id number]} 

## 2021-09-23 NOTE — Telephone Encounter (Signed)
Patient called and stated she had recently been informed of a positive syphilis test following discharge from the hospital. Per the hospitalist's note, she was told to call Dr. Gwenevere Ghazi office to determine next steps. ? ?Patient explained she was treated for syphilis in the past by care team at [facility]. She has reached out to that team, and is awaiting a response. ? ?Per Dr. Delaine Lame, communicated to patient that she was welcome to call back and schedule a new patient appointment if desired. Explained that she could also seek assessment/treatment at the [facility]. ? ?The patient stated understanding and had no additional questions. Per Dr. Delaine Lame, if patient chooses to schedule with our office, she should be scheduled for tomorrow. ? ?Binnie Kand, RN  ?

## 2021-09-23 NOTE — Telephone Encounter (Signed)
Incoming call on triage line from pt in regards to a message sent to her Mychart from the Hospitalist about a positive syphilis test. Pt states she was told during her hospital stay that syphilis test was negative, then later received the message after being discharged. Advised pt to call the number listed in the message and inquire about how to proceed. Pt expressed understanding.   ?

## 2021-09-25 ENCOUNTER — Ambulatory Visit: Payer: BC Managed Care – PPO | Admitting: Internal Medicine

## 2021-09-25 LAB — CULTURE, BLOOD (ROUTINE X 2)
Culture: NO GROWTH
Culture: NO GROWTH

## 2021-09-25 NOTE — Progress Notes (Deleted)
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   CEFPODOXIME (VANTIN) 200 MG TABLET    Take 1 tablet (200 mg total) by mouth 2 (two) times daily. After you finish this abx start trimpex   DOCUSATE SODIUM (COLACE) 100 MG CAPSULE    Take 1 capsule (100 mg total) by mouth 2 (two) times daily.   ERGOCALCIFEROL (VITAMIN D2) 1.25 MG (50000 UT) CAPSULE    Take by mouth.   ESCITALOPRAM (LEXAPRO) 10 MG TABLET    Take 10 mg by mouth daily.   ONDANSETRON (ZOFRAN-ODT) 4 MG DISINTEGRATING TABLET    Take 1 tablet (4 mg total) by mouth every 8 (eight) hours as needed for nausea or vomiting.   OXYBUTYNIN (DITROPAN-XL) 10 MG 24 HR TABLET    Take 1 tablet (10 mg total) by mouth daily.   OXYCODONE (OXY IR/ROXICODONE) 5 MG IMMEDIATE RELEASE TABLET    Take 1-2 tablets (5-10 mg total) by mouth every 6 (six) hours as needed for up to 3 days for moderate pain.   TAMSULOSIN (FLOMAX) 0.4 MG CAPS CAPSULE    Take 1 capsule (0.4 mg total) by mouth daily after supper.   TRAZODONE  (DESYREL) 50 MG TABLET    Take 1 tablet (50 mg total) by mouth at bedtime.   TRIMETHOPRIM (TRIMPEX) 100 MG TABLET    Take 1 tablet (100 mg total) by mouth 2 (two) times daily. For UTI prevention; start the day after completing treatment course of antibiotics.  Modified Medications   No medications on file  Discontinued Medications   No medications on file    Subjective: ***   Review of Systems: ROS  Past Medical History:  Diagnosis Date   Anemia    Anxiety    Depression    Febrile seizure (HCC) 10/10/2014   When she was an infant   History of kidney stones    Ovarian cyst    Syphilis 12/12/2020    Social History   Tobacco Use   Smoking status: Former    Types: E-cigarettes, Cigarettes    Quit date: 01/07/2017    Years since quitting: 4.7   Smokeless tobacco: Never   Tobacco comments:    1 pack would last 2-3 weeks  Vaping Use   Vaping Use: Every day   Substances: Nicotine, Flavoring  Substance Use Topics   Alcohol use: Yes    Comment: 1-2 x per month mixed drinks or wine   Drug use: Yes    Types: Marijuana, MDMA (Ecstacy)  Family History  Problem Relation Age of Onset   Migraines Father    Hypertension Father    Cancer Maternal Grandmother 59       colon and uterine   Cancer Maternal Grandfather        prostate   Arthritis Paternal Grandmother    Diabetes Paternal Grandmother    Asthma Paternal Grandfather    Depression Mother    Cancer Maternal Aunt 51       breast   Diabetes Maternal Uncle    Mental illness Paternal Aunt     Allergies  Allergen Reactions   Other Shortness Of Breath    Medication is "Durahistamine" used for coughing when she was a child   Benadryl [Diphenhydramine] Hives   Clindamycin/Lincomycin Nausea And Vomiting    Scratchy throat   Gardasil 9 [Hpv 9-Valent Recomb Vaccine]     Unknown reaction   Amoxicillin Hives   Latex Rash    Health Maintenance  Topic Date Due   COVID-19 Vaccine (1) Never done   HPV VACCINES (1 -  2-dose series) Never done   Hepatitis C Screening  Never done   PAP-Cervical Cytology Screening  06/07/2021   PAP SMEAR-Modifier  06/07/2021   INFLUENZA VACCINE  12/15/2021   TETANUS/TDAP  02/03/2028   HIV Screening  Completed    Objective:  There were no vitals filed for this visit. There is no height or weight on file to calculate BMI.  Physical Exam  Lab Results Lab Results  Component Value Date   WBC 7.1 09/22/2021   HGB 10.5 (L) 09/22/2021   HCT 33.3 (L) 09/22/2021   MCV 84.7 09/22/2021   PLT 328 09/22/2021    Lab Results  Component Value Date   CREATININE 1.03 (H) 09/21/2021   BUN 12 09/21/2021   NA 139 09/21/2021   K 4.2 09/21/2021   CL 107 09/21/2021   CO2 28 09/21/2021    Lab Results  Component Value Date   ALT 15 05/27/2021   AST 19 05/27/2021   ALKPHOS 88 05/27/2021   BILITOT 0.6 05/27/2021    Lab Results  Component Value Date   CHOL 173 01/08/2019   HDL 39.00 (L) 01/08/2019   LDLCALC 116 (H) 01/08/2019   TRIG 93.0 01/08/2019   CHOLHDL 4 01/08/2019   Lab Results  Component Value Date   LABRPR Reactive (A) 09/20/2021   No results found for: HIV1RNAQUANT, HIV1RNAVL, CD4TABS   Problem List Items Addressed This Visit   None     Vanessa Earthly, MD [facility] 09/25/2021, 9:46 AM

## 2021-10-14 ENCOUNTER — Encounter: Payer: 59 | Admitting: Urology

## 2021-10-15 ENCOUNTER — Ambulatory Visit (INDEPENDENT_AMBULATORY_CARE_PROVIDER_SITE_OTHER): Payer: BC Managed Care – PPO | Admitting: Urology

## 2021-10-15 ENCOUNTER — Encounter: Payer: Self-pay | Admitting: Urology

## 2021-10-15 VITALS — BP 119/75 | HR 81 | Ht 65.0 in | Wt 230.0 lb

## 2021-10-15 DIAGNOSIS — N200 Calculus of kidney: Secondary | ICD-10-CM

## 2021-10-15 DIAGNOSIS — Z466 Encounter for fitting and adjustment of urinary device: Secondary | ICD-10-CM

## 2021-10-15 MED ORDER — CIPROFLOXACIN HCL 500 MG PO TABS: 500.0000 mg | ORAL_TABLET | Freq: Two times a day (BID) | ORAL | 0 refills | Status: AC

## 2021-10-15 MED ORDER — CIPROFLOXACIN HCL 500 MG PO TABS
500.0000 mg | ORAL_TABLET | Freq: Once | ORAL | Status: AC
  Administered 2021-10-15: 500 mg via ORAL

## 2021-10-15 NOTE — Progress Notes (Signed)
Cystoscopy Procedure Note:  Indication: Stent removal s/p 08/24/2021 left robotic pyeloplasty and basket extraction of multiple left renal stones.  Has been on prophylactic trimethoprim for recurrent UTIs, recent CT with no evidence of abnormality and stent in appropriate position  After informed consent and discussion of the procedure and its risks, Vanessa Hester was positioned and prepped in the standard fashion. Cystoscopy was performed with a flexible cystoscope. The stent was grasped with flexible graspers and removed in its entirety. The patient tolerated the procedure well.  Findings: Uncomplicated stent removal  Assessment and Plan: Cipro 500 mg twice daily x3 days to sterilize urine with chronic stent RTC 2 months renal ultrasound  Sondra Come, MD 10/15/2021

## 2021-10-16 LAB — URINALYSIS, COMPLETE
Bilirubin, UA: NEGATIVE
Glucose, UA: NEGATIVE
Nitrite, UA: NEGATIVE
Specific Gravity, UA: 1.025 (ref 1.005–1.030)
Urobilinogen, Ur: 0.2 mg/dL (ref 0.2–1.0)
pH, UA: 6.5 (ref 5.0–7.5)

## 2021-10-16 LAB — MICROSCOPIC EXAMINATION: WBC, UA: >30 /hpf — AB (ref 0–5)

## 2021-12-15 ENCOUNTER — Ambulatory Visit: Payer: BC Managed Care – PPO | Admitting: Urology

## 2021-12-28 ENCOUNTER — Ambulatory Visit (HOSPITAL_COMMUNITY)
Admission: EM | Admit: 2021-12-28 | Discharge: 2021-12-28 | Disposition: A | Discharge status: Home / Self Care | Payer: BC Managed Care – PPO | Attending: Internal Medicine | Admitting: Internal Medicine

## 2021-12-28 ENCOUNTER — Encounter (HOSPITAL_COMMUNITY): Payer: Self-pay | Admitting: Emergency Medicine

## 2021-12-28 DIAGNOSIS — J039 Acute tonsillitis, unspecified: Secondary | ICD-10-CM | POA: Insufficient documentation

## 2021-12-28 LAB — POCT RAPID STREP A, ED / UC: Streptococcus, Group A Screen (Direct): NEGATIVE

## 2021-12-28 LAB — RPR
RPR Ser Ql: REACTIVE — AB
RPR Titer: 1:2 {titer}

## 2021-12-28 MED ORDER — PREDNISONE 20 MG PO TABS: 40.0000 mg | ORAL_TABLET | Freq: Every day | ORAL | 0 refills | Status: DC

## 2021-12-28 MED ORDER — METHYLPREDNISOLONE SODIUM SUCC 125 MG IJ SOLR
INTRAMUSCULAR | Status: AC
  Filled 2021-12-28: qty 2

## 2021-12-28 MED ORDER — METHYLPREDNISOLONE SODIUM SUCC 125 MG IJ SOLR
125.0000 mg | Freq: Once | INTRAMUSCULAR | Status: AC
  Administered 2021-12-28: 125 mg via INTRAMUSCULAR

## 2021-12-28 NOTE — Discharge Instructions (Addendum)
Symptoms today could possibly related to a virus versus syphilis, negative for strep which is bacteria  Due to your possible reaction to penicillin we will not give you an injection of medication here in the office today and a referral has been placed to infectious disease for further evaluation and management   you have been given an injection of steroids here in the office to help reduce tonsillar swelling, starting tomorrow begin use of prednisone pills to continue the process above  May attempt use of salt water gargles, throat lozenges, warm liquids, Tylenol, ibuprofen for additional supportive measures  As syphilis can only be tested by blood work we will complete this, labs pending   At any point if your symptoms worsen please go to the nearest emergency department for further management

## 2021-12-28 NOTE — ED Triage Notes (Addendum)
Woke up and felt like her throat was swollen. States she has a hx of frequent strep infections. Says today it feels different, and that her tonsils are touching. Not drooling, but states it's hard/painful to swallow. Denies problems breathing, but reports feeling SOB. Has not taken any OTC meds yet today to help with symptoms. Tonsils appear inflammed, but not kissing, no exudate visible. No visible airway/throat/oral obstruction

## 2021-12-28 NOTE — ED Provider Notes (Signed)
[facility]    CSN: [id number] Arrival date & time: 12/28/21  0857      History   Chief Complaint Chief Complaint  Patient presents with   Sore Throat    HPI Vanessa Hester is a 26 y.o. female.   Patient presents with sore throat, lymph node pain and throat swelling beginning upon awakening this morning. Difficulty tolerating food and liquid this morning.    No known sick contacts.  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  Has not attempted treatment of symptoms.  Denies fever, chills, body aches, nasal congestion, cough, ear pain.     Past Medical History:  Diagnosis Date   Anemia    Anxiety    Depression    Febrile seizure (HCC) 10/10/2014   When she was an infant   History of kidney stones    Kidney stone    Ovarian cyst    Syphilis 12/12/2020    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 05/30/2021   Hypokalemia 05/29/2021   Obesity (BMI 35.0-39.9 without comorbidity) 05/29/2021   Acute pyelonephritis 05/28/2021   Nexplanon in place 06/07/2018   Cellulitis of foot, right 03/01/2018   History of PID 11/05/2016   Chlamydia infection 11/02/2016   Ovarian cyst 07/22/2016   Anemia 02/05/2015   Depression 02/05/2015   Menorrhagia 02/04/2015   Fatigue 02/04/2015   Tension headache 02/04/2015   IBS (irritable bowel syndrome) 02/04/2015   Vitamin D deficiency 02/04/2015   Dysfunctional uterine bleeding 09/20/2013    Past Surgical History:  Procedure Laterality Date   KNEE SURGERY Left    ROBOT ASSISTED PYELOPLASTY Left 08/24/2021   Procedure: XI ROBOTIC ASSISTED PYELOPLASTY WITH STENT PLACEMENT;  Surgeon: Sondra Come, MD;  Location: [facility];  Service: Urology;  Laterality:  Left;   TUMOR REMOVAL  10/10/14   tumor removed from left knee.     OB History     Gravida  0   Para  0   Term  0   Preterm  0   AB  0   Living  0      SAB  0   IAB  0   Ectopic  0   Multiple  0   Live Births  0            Home Medications    Prior to Admission medications   Medication Sig Start Date End Date Taking? Authorizing Provider  acetaminophen (TYLENOL) 325 MG tablet Take 650 mg by mouth every 6 (six) hours as needed for mild pain.    [provider]  cefpodoxime (VANTIN) 200 MG tablet Take 1 tablet (200 mg total) by mouth 2 (two) times daily. After you finish this abx start trimpex 09/22/21   Joseph Art, DO  docusate sodium (COLACE) 100 MG capsule Take 1 capsule (100 mg total) by mouth 2 (two) times daily. 08/25/21   Vaillancourt, Lelon Mast, PA-C  ergocalciferol (VITAMIN D2) 1.25 MG (50000 UT) capsule Take by mouth. 08/17/21 08/17/22  [provider]  escitalopram (LEXAPRO) 10 MG tablet Take 10 mg by mouth daily. 08/05/21   [provider]  oxybutynin (DITROPAN-XL) 10 MG 24 hr tablet Take 1 tablet (10 mg total) by mouth daily. 08/25/21   Vaillancourt,  Samantha, PA-C  tamsulosin (FLOMAX) 0.4 MG CAPS capsule Take 1 capsule (0.4 mg total) by mouth daily after supper. 09/22/21   Joseph Art, DO  traZODone (DESYREL) 50 MG tablet Take 1 tablet (50 mg total) by mouth at bedtime. 05/30/21   Danford, Earl Lites, MD    Family History Family History  Problem Relation Age of Onset   Migraines Father    Hypertension Father    Cancer Maternal Grandmother 82       colon and uterine   Cancer Maternal Grandfather        prostate   Arthritis Paternal Grandmother    Diabetes Paternal Grandmother    Asthma Paternal Grandfather    Depression Mother    Cancer Maternal Aunt 47       breast   Diabetes Maternal Uncle    Mental illness Paternal Aunt     Social History Social History   Tobacco Use   Smoking status: Former    Types:  E-cigarettes, Cigarettes    Quit date: 01/07/2017    Years since quitting: 4.9   Smokeless tobacco: Never   Tobacco comments:    1 pack would last 2-3 weeks  Vaping Use   Vaping Use: Every day   Substances: Nicotine, Flavoring  Substance Use Topics   Alcohol use: Yes    Comment: 1-2 x per month mixed drinks or wine   Drug use: Yes    Types: Marijuana, MDMA (Ecstacy)     Allergies   Other, Benadryl [diphenhydramine], Clindamycin/lincomycin, Gardasil 9 [hpv 9-valent recomb vaccine], Amoxicillin, and Latex   Review of Systems Review of Systems   Physical Exam Triage Vital Signs ED Triage Vitals  Enc Vitals Group     BP 12/28/21 0911 137/64     Pulse Rate 12/28/21 0911 97     Resp 12/28/21 0911 16     Temp 12/28/21 0911 98.3 F (36.8 C)     Temp Source 12/28/21 0911 Oral     SpO2 12/28/21 0911 96 %     Weight --      Height --      Head Circumference --      Peak Flow --      Pain Score 12/28/21 0914 8     Pain Loc --      Pain Edu? --      Excl. in GC? --    No data found.  Updated Vital Signs BP 137/64 (BP Location: Left Arm)   Pulse 97   Temp 98.3 F (36.8 C) (Oral)   Resp 16   SpO2 96%   Visual Acuity Right Eye Distance:   Left Eye Distance:   Bilateral Distance:    Right Eye Near:   Left Eye Near:    Bilateral Near:     Physical Exam Constitutional:      Appearance: She is well-developed.  HENT:     Head: Normocephalic.     Right Ear: Tympanic membrane and ear canal normal.     Left Ear: Tympanic membrane and ear canal normal.     Nose: No congestion or rhinorrhea.     Mouth/Throat:     Mouth: Mucous membranes are moist.     Pharynx: Posterior oropharyngeal erythema present.     Tonsils: No tonsillar exudate. 3+ on the right. 3+ on the left.  Cardiovascular:     Rate and Rhythm: Normal rate and regular rhythm.     Heart sounds: Normal heart sounds.  Pulmonary:  Effort: Pulmonary effort is normal.     Breath sounds: Normal breath  sounds.  Musculoskeletal:     Cervical back: Normal range of motion and neck supple.  Skin:    General: Skin is warm and dry.  Neurological:     General: No focal deficit present.     Mental Status: She is alert and oriented to person, place, and time.  Psychiatric:        Mood and Affect: Mood normal.        Behavior: Behavior normal.      UC Treatments / Results  Labs (all labs ordered are listed, but only abnormal results are displayed) Labs Reviewed  CULTURE, GROUP A STREP [facility])  POCT RAPID STREP A, ED / UC    EKG   Radiology No results found.  Procedures Procedures (including critical care time)  Medications Ordered in UC Medications - No data to display  Initial Impression / Assessment and Plan / UC Course  I have reviewed the triage vital signs and the nursing notes.  Pertinent labs & imaging results that were available during my care of the patient were reviewed by me and considered in my medical decision making (see chart for details).  Tonsillitis  Vital signs are stable and patient has no signs of distress, erythema and tonsillar adenopathy without exudate is noted to the oropharynx, rapid strep test is negative, sent for culture, etiology could possibly be viral vs syphilis, RPR pending, anaphylactic reaction to penicillin confirmed with patient, methylprednisolone injection given in office and prednisone burst prescribed for outpatient, during hospitalization patient did receive penicillin with desensitization and therefore internal referral given infectious disease for further management if syphilis is positive recommended additional supportive measures for treatment outpatient Final Clinical Impressions(s) / UC Diagnoses   Final diagnoses:  None   Discharge Instructions   None    ED Prescriptions   None    PDMP not reviewed this encounter.   Valinda Hoar, [place] 12/28/21 1533

## 2021-12-29 LAB — CULTURE, GROUP A STREP (THRC)

## 2021-12-29 LAB — T.PALLIDUM AB, TOTAL: T Pallidum Abs: REACTIVE — AB

## 2021-12-30 ENCOUNTER — Telehealth (HOSPITAL_COMMUNITY): Payer: Self-pay | Admitting: Emergency Medicine

## 2021-12-30 MED ORDER — AZITHROMYCIN 250 MG PO TABS: 250.0000 mg | ORAL_TABLET | Freq: Every day | ORAL | 0 refills | Status: DC

## 2021-12-31 ENCOUNTER — Ambulatory Visit: Payer: BC Managed Care – PPO | Admitting: Infectious Diseases

## 2022-01-04 ENCOUNTER — Telehealth (HOSPITAL_COMMUNITY): Payer: Self-pay

## 2022-01-19 ENCOUNTER — Other Ambulatory Visit: Payer: Self-pay

## 2022-01-19 ENCOUNTER — Emergency Department: Payer: BC Managed Care – PPO

## 2022-01-19 ENCOUNTER — Emergency Department
Admission: EM | Admit: 2022-01-19 | Discharge: 2022-01-19 | Disposition: A | Discharge status: Home / Self Care | Payer: BC Managed Care – PPO | Attending: Emergency Medicine | Admitting: Emergency Medicine

## 2022-01-19 DIAGNOSIS — R1030 Lower abdominal pain, unspecified: Secondary | ICD-10-CM | POA: Insufficient documentation

## 2022-01-19 DIAGNOSIS — O26891 Other specified pregnancy related conditions, first trimester: Secondary | ICD-10-CM | POA: Insufficient documentation

## 2022-01-19 DIAGNOSIS — Z3A01 Less than 8 weeks gestation of pregnancy: Secondary | ICD-10-CM | POA: Diagnosis not present

## 2022-01-19 DIAGNOSIS — Z3491 Encounter for supervision of normal pregnancy, unspecified, first trimester: Secondary | ICD-10-CM

## 2022-01-19 LAB — COMPREHENSIVE METABOLIC PANEL
ALT: 13 U/L (ref 0–44)
AST: 18 U/L (ref 15–41)
Albumin: 3.6 g/dL (ref 3.5–5.0)
Alkaline Phosphatase: 53 U/L (ref 38–126)
Anion gap: 9 (ref 5–15)
BUN: 14 mg/dL (ref 6–20)
CO2: 22 mmol/L (ref 22–32)
Calcium: 8.9 mg/dL (ref 8.9–10.3)
Chloride: 106 mmol/L (ref 98–111)
Creatinine, Ser: 0.94 mg/dL (ref 0.44–1.00)
GFR, Estimated: >60 mL/min (ref >60)
Glucose, Bld: 118 mg/dL — ABNORMAL HIGH (ref 70–99)
Potassium: 4 mmol/L (ref 3.5–5.1)
Sodium: 137 mmol/L (ref 135–145)
Total Bilirubin: 0.7 mg/dL (ref 0.3–1.2)
Total Protein: 6.7 g/dL (ref 6.5–8.1)

## 2022-01-19 LAB — CBC
HCT: 35.3 % — ABNORMAL LOW (ref 36.0–46.0)
Hemoglobin: 11.1 g/dL — ABNORMAL LOW (ref 12.0–15.0)
MCH: 27.1 pg (ref 26.0–34.0)
MCHC: 31.4 g/dL (ref 30.0–36.0)
MCV: 86.3 fL (ref 80.0–100.0)
Platelets: 257 10*3/uL (ref 150–400)
RBC: 4.09 MIL/uL (ref 3.87–5.11)
RDW: 14.5 % (ref 11.5–15.5)
WBC: 7.9 10*3/uL (ref 4.0–10.5)
nRBC: 0 % (ref 0.0–0.2)

## 2022-01-19 LAB — URINALYSIS, ROUTINE W REFLEX MICROSCOPIC
Bilirubin Urine: NEGATIVE
Glucose, UA: NEGATIVE mg/dL
Hgb urine dipstick: NEGATIVE
Ketones, ur: NEGATIVE mg/dL
Leukocytes,Ua: NEGATIVE
Nitrite: NEGATIVE
Protein, ur: NEGATIVE mg/dL
Specific Gravity, Urine: 1.021 (ref 1.005–1.030)
pH: 5 (ref 5.0–8.0)

## 2022-01-19 LAB — LIPASE, BLOOD: Lipase: 47 U/L (ref 11–51)

## 2022-01-19 LAB — HCG, QUANTITATIVE, PREGNANCY: hCG, Beta Chain, Quant, S: 108469 m[IU]/mL — ABNORMAL HIGH (ref <5)

## 2022-01-19 LAB — POC URINE PREG, ED: Preg Test, Ur: POSITIVE — AB

## 2022-01-19 MED ORDER — ONDANSETRON HCL 4 MG PO TABS: 4.0000 mg | ORAL_TABLET | Freq: Three times a day (TID) | ORAL | 0 refills | Status: DC | PRN

## 2022-01-19 NOTE — ED Provider Notes (Signed)
[facility] Provider Note    Event Date/Time   First MD Initiated Contact with Patient 01/19/22 1042     (approximate)   History   Abdominal Pain   HPI  Vanessa Hester is a 26 y.o. female who presents to the emergency department today because of concerns for lower abdominal pain.  Patient has been feeling nauseous over the past few days.  Took a pregnancy test over the weekend which was positive.  Today started having lower abdominal pain.  She thinks her last period was in July although had some spotting early last month however it was much less than her normal period.  This is her first pregnancy.     Physical Exam   Triage Vital Signs: ED Triage Vitals [01/19/22 0952]  Enc Vitals Group     BP 132/61     Pulse Rate 89     Resp 17     Temp 98 F (36.7 C)     Temp src      SpO2 100 %     Weight      Height      Head Circumference      Peak Flow      Pain Score      Pain Loc      Pain Edu?      Excl. in GC?     Most recent vital signs: Vitals:   01/19/22 0952  BP: 132/61  Pulse: 89  Resp: 17  Temp: 98 F (36.7 C)  SpO2: 100%   General: Awake, alert, oriented. CV:  Good peripheral perfusion. Regular rate and rhythm. Resp:  Normal effort. Lungs clear. Abd:  No distention. Non tender.   ED Results / Procedures / Treatments   Labs (all labs ordered are listed, but only abnormal results are displayed) Labs Reviewed  COMPREHENSIVE METABOLIC PANEL - Abnormal; Notable for the following components:      Result Value   Glucose, Bld 118 (*)    All other components within normal limits  CBC - Abnormal; Notable for the following components:   Hemoglobin 11.1 (*)    HCT 35.3 (*)    All other components within normal limits  URINALYSIS, ROUTINE W REFLEX MICROSCOPIC - Abnormal; Notable for the following components:   Color, Urine YELLOW (*)    APPearance HAZY (*)    All other components within normal limits  HCG, QUANTITATIVE,  PREGNANCY - Abnormal; Notable for the following components:   hCG, Beta Chain, Quant, S [id number] (*)    All other components within normal limits  POC URINE PREG, ED - Abnormal; Notable for the following components:   Preg Test, Ur POSITIVE (*)    All other components within normal limits  LIPASE, BLOOD     EKG  None   RADIOLOGY I independently interpreted and visualized the US OB. My interpretation: Intrauterine pregnancy Radiology interpretation:  IMPRESSION:  Single live intrauterine gestation at 6 weeks 3 days EGA by  crown-rump length.    Small subchronic hemorrhage.     PROCEDURES:  Critical Care performed: No  Procedures   MEDICATIONS ORDERED IN ED: Medications - No data to display   IMPRESSION / MDM / ASSESSMENT AND PLAN / ED COURSE  I reviewed the triage vital signs and the nursing notes.                              Differential diagnosis  includes, but is not limited to, UTI, ectopic pregnancy.  Patient's presentation is most consistent with acute presentation with potential threat to life or bodily function.  Patient presented to the emergency department today because of concerns for lower abdominal pain in the setting of recent home positive pregnancy test.  Ultrasound was performed which showed a single live intrauterine pregnancy.  I discussed this with the patient.  She states she is already started taking prenatal vitamins.  Discussed importance of close OB/GYN follow-up.    FINAL CLINICAL IMPRESSION(S) / ED DIAGNOSES   Final diagnoses:  First trimester pregnancy     Note:  This document was prepared using Dragon voice recognition software and may include unintentional dictation errors.    Phineas Semen, MD 01/19/22 1328

## 2022-01-19 NOTE — Discharge Instructions (Signed)
Please seek medical attention for any high fevers, chest pain, shortness of breath, change in behavior, persistent vomiting, bloody stool or any other new or concerning symptoms.  

## 2022-01-19 NOTE — ED Notes (Signed)
Dc instructions reviewed with pt. No questions or concerns at this time. Will follow up with OBGYN tomorrow. Ambulated without difficulty out of ED

## 2022-01-19 NOTE — ED Triage Notes (Signed)
Pt comes with c/o lower belly pain. Pt states 6 +preg test at home.

## 2022-01-21 ENCOUNTER — Telehealth: Payer: Self-pay | Admitting: *Deleted

## 2022-01-21 MED ORDER — PROMETHAZINE HCL 25 MG PO TABS: 25.0000 mg | ORAL_TABLET | Freq: Four times a day (QID) | ORAL | 2 refills | Status: DC | PRN

## 2022-01-21 NOTE — Telephone Encounter (Signed)
Pt called requesting meds for nausea in early pregnancy

## 2022-01-21 NOTE — Telephone Encounter (Signed)
-----   Message from Alvin Critchley sent at 01/21/2022 10:21 AM EDT ----- Regarding: Medication Request Sickness   Pharmacy : [facility] on [address]

## 2022-02-04 ENCOUNTER — Emergency Department
Admission: EM | Admit: 2022-02-04 | Discharge: 2022-02-04 | Disposition: A | Discharge status: Home / Self Care | Payer: BC Managed Care – PPO | Attending: Emergency Medicine | Admitting: Emergency Medicine

## 2022-02-04 ENCOUNTER — Encounter: Payer: Self-pay | Admitting: Emergency Medicine

## 2022-02-04 ENCOUNTER — Other Ambulatory Visit: Payer: Self-pay

## 2022-02-04 DIAGNOSIS — B349 Viral infection, unspecified: Secondary | ICD-10-CM | POA: Diagnosis not present

## 2022-02-04 DIAGNOSIS — O26891 Other specified pregnancy related conditions, first trimester: Secondary | ICD-10-CM | POA: Diagnosis present

## 2022-02-04 DIAGNOSIS — K226 Gastro-esophageal laceration-hemorrhage syndrome: Secondary | ICD-10-CM | POA: Diagnosis not present

## 2022-02-04 DIAGNOSIS — Z20822 Contact with and (suspected) exposure to covid-19: Secondary | ICD-10-CM | POA: Diagnosis not present

## 2022-02-04 DIAGNOSIS — O98511 Other viral diseases complicating pregnancy, first trimester: Secondary | ICD-10-CM | POA: Diagnosis not present

## 2022-02-04 DIAGNOSIS — Z3A08 8 weeks gestation of pregnancy: Secondary | ICD-10-CM | POA: Diagnosis not present

## 2022-02-04 DIAGNOSIS — O99611 Diseases of the digestive system complicating pregnancy, first trimester: Secondary | ICD-10-CM | POA: Diagnosis not present

## 2022-02-04 LAB — URINALYSIS, ROUTINE W REFLEX MICROSCOPIC
Bilirubin Urine: NEGATIVE
Glucose, UA: NEGATIVE mg/dL
Ketones, ur: NEGATIVE mg/dL
Nitrite: NEGATIVE
Protein, ur: NEGATIVE mg/dL
Specific Gravity, Urine: 1.02 (ref 1.005–1.030)
pH: 6 (ref 5.0–8.0)

## 2022-02-04 LAB — COMPREHENSIVE METABOLIC PANEL
ALT: 10 U/L (ref 0–44)
AST: 16 U/L (ref 15–41)
Albumin: 3.5 g/dL (ref 3.5–5.0)
Alkaline Phosphatase: 42 U/L (ref 38–126)
Anion gap: 6 (ref 5–15)
BUN: 10 mg/dL (ref 6–20)
CO2: 22 mmol/L (ref 22–32)
Calcium: 8.9 mg/dL (ref 8.9–10.3)
Chloride: 108 mmol/L (ref 98–111)
Creatinine, Ser: 0.68 mg/dL (ref 0.44–1.00)
GFR, Estimated: >60 mL/min (ref >60)
Glucose, Bld: 95 mg/dL (ref 70–99)
Potassium: 4.1 mmol/L (ref 3.5–5.1)
Sodium: 136 mmol/L (ref 135–145)
Total Bilirubin: 0.2 mg/dL — ABNORMAL LOW (ref 0.3–1.2)
Total Protein: 7 g/dL (ref 6.5–8.1)

## 2022-02-04 LAB — CBC
HCT: 37.9 % (ref 36.0–46.0)
Hemoglobin: 12.1 g/dL (ref 12.0–15.0)
MCH: 27 pg (ref 26.0–34.0)
MCHC: 31.9 g/dL (ref 30.0–36.0)
MCV: 84.6 fL (ref 80.0–100.0)
Platelets: 268 10*3/uL (ref 150–400)
RBC: 4.48 MIL/uL (ref 3.87–5.11)
RDW: 14.3 % (ref 11.5–15.5)
WBC: 9.1 10*3/uL (ref 4.0–10.5)
nRBC: 0 % (ref 0.0–0.2)

## 2022-02-04 LAB — GROUP A STREP BY PCR: Group A Strep by PCR: NOT DETECTED

## 2022-02-04 LAB — LIPASE, BLOOD: Lipase: 40 U/L (ref 11–51)

## 2022-02-04 LAB — CK: Total CK: 41 U/L (ref 38–234)

## 2022-02-04 LAB — RESP PANEL BY RT-PCR (FLU A&B, COVID) ARPGX2
Influenza A by PCR: NEGATIVE
Influenza B by PCR: NEGATIVE
SARS Coronavirus 2 by RT PCR: NEGATIVE

## 2022-02-04 MED ORDER — ONDANSETRON HCL 4 MG/2ML IJ SOLN
4.0000 mg | Freq: Once | INTRAMUSCULAR | Status: AC
  Administered 2022-02-04: 4 mg via INTRAVENOUS
  Filled 2022-02-04: qty 2

## 2022-02-04 MED ORDER — SODIUM CHLORIDE 0.9 % IV BOLUS
1000.0000 mL | Freq: Once | INTRAVENOUS | Status: AC
  Administered 2022-02-04: 1000 mL via INTRAVENOUS

## 2022-02-04 MED ORDER — ONDANSETRON 4 MG PO TBDP: 4.0000 mg | ORAL_TABLET | Freq: Three times a day (TID) | ORAL | 0 refills | Status: AC | PRN

## 2022-02-04 NOTE — Discharge Instructions (Addendum)
We discussed admission versus going home given the concern for blood in your vomit but given your hemoglobin is stable and you have no black tarry stool we have opted to let you go home but if you develop black tarry stool or you vomit blood again then you should return to the ER for recheck of hemoglobin and further evaluation.

## 2022-02-04 NOTE — ED Provider Notes (Signed)
[facility] Provider Note    Event Date/Time   First MD Initiated Contact with Patient 02/04/22 [phone]     (approximate)   History   Generalized Body Aches, Emesis, and Fatigue   HPI  Vanessa Hester is a 26 y.o. female with history of neurosyphilis 1 year ago not currently on penicillin, recent pregnancy who comes in with concerns for generalized body aches, emesis and fatigue.  Patient reports 2 to 3 days of body aches, emesis and increasing fatigue.  Does report a positive contact of strep but does not really have any significant sore throat.  She reports that she had some vomiting with some blood streaks in it as well.  Does not have any pictures of it.  She reports that this happened yesterday.  No further episodes today.  She denies any black tarry stool.  Denies any abdominal pain.  Patient is currently around [redacted] weeks pregnant.  Patient had an ultrasound done on 9/5 that showed patient was 6 weeks and 3 days.   Physical Exam   Triage Vital Signs: ED Triage Vitals  Enc Vitals Group     BP 02/04/22 0849 120/75     Pulse Rate 02/04/22 0849 85     Resp 02/04/22 0849 18     Temp 02/04/22 0849 98.1 F (36.7 C)     Temp Source 02/04/22 0849 Oral     SpO2 02/04/22 0849 98 %     Weight 02/04/22 0850 238 lb (108 kg)     Height 02/04/22 0850 5\' 5"  (1.651 m)     Head Circumference --      Peak Flow --      Pain Score 02/04/22 0849 7     Pain Loc --      Pain Edu? --      Excl. in [place]? --     Most recent vital signs: Vitals:   02/04/22 0849  BP: 120/75  Pulse: 85  Resp: 18  Temp: 98.1 F (36.7 C)  SpO2: 98%     General: Awake, no distress.  CV:  Good peripheral perfusion.  Resp:  Normal effort.  Abd:  No distention.  Soft and nontender Other:  Oropharynx without any exudates or tonsillar swelling   ED Results / Procedures / Treatments   Labs (all labs ordered are listed, but only abnormal results are displayed) Labs Reviewed   COMPREHENSIVE METABOLIC PANEL - Abnormal; Notable for the following components:      Result Value   Total Bilirubin 0.2 (*)    All other components within normal limits  LIPASE, BLOOD  CBC  URINALYSIS, ROUTINE W REFLEX MICROSCOPIC     RADIOLOGY Declined xray  PROCEDURES:  Critical Care performed: No  Procedures   MEDICATIONS ORDERED IN ED: Medications  sodium chloride 0.9 % bolus 1,000 mL (1,000 mLs Intravenous New Bag/Given 02/04/22 1009)  ondansetron (ZOFRAN) injection 4 mg (4 mg Intravenous Given 02/04/22 1008)     IMPRESSION / MDM / ASSESSMENT AND PLAN / ED COURSE  I reviewed the triage vital signs and the nursing notes.   Patient's presentation is most consistent with acute presentation with potential threat to life or bodily function.   Patient comes in with multiple symptoms.  Suspect most likely viral illness.  Will test for COVID, flu.  We will give patient some fluids, Zofran.  Offered patient STD testing but she reports being with the same partner and reports recent testing therefore she declines today.  We discussed chest x-ray but given afebrile with normal white count she declines chest x-ray.  We discussed the blood noted in her vomiting suspect more likely Mallory-Weiss tears and this all happened yesterday and given her hemoglobin is normal I have very low suspicion for ulcer, variceal bleed.  She denies any history of alcoholism, cirrhosis or liver issues, significant ibuprofen or aspirin use.  I recommended rectal exam to see if there is any active bleeding but she declines a rectal exam but is adamant that she has not had any black tarry stools.  We did discuss the use risk of Zofran with cleft palate development but patient would like to proceed. Strep Is negative.  Urine without evidence of UTI given the squamous cells.  COVID and flu are negative lipase is normal CMP reassuring.  CBC reassuring.  Patient denied any symptoms of concerns for vaginal bleeding  gush of fluids and she is already had an ultrasound showing a live intrauterine pregnancy therefore no need to repeat ultrasound today  Reevaluated patient she reports resolution of nausea and feeling much better.  We discussed admission for trending out hemoglobins GI consultation and possible endoscopy but given this all happened yesterday the vomiting with blood-tinged and her hemoglobin is stable and she denies any melena we discussed the other option of going home. GBS score is zero. Patient preferred to go home but if she develops return of vomiting with blood in it she will return for repeat hemoglobin at that time.  She expressed understanding and felt comfortable with this plan.  The patient is on the cardiac monitor to evaluate for evidence of arrhythmia and/or significant heart rate changes.      FINAL CLINICAL IMPRESSION(S) / ED DIAGNOSES   Final diagnoses:  Viral illness  [redacted] weeks gestation of pregnancy  Mallory-Weiss tear     Rx / DC Orders   ED Discharge Orders          Ordered    ondansetron (ZOFRAN-ODT) 4 MG disintegrating tablet  Every 8 hours PRN        02/04/22 1208             Note:  This document was prepared using Dragon voice recognition software and may include unintentional dictation errors.   Concha Se, MD 02/04/22 1209

## 2022-02-04 NOTE — ED Triage Notes (Signed)
Pt to ER states she is [redacted] weeks pregnant.  States she has not been feeling well for several days.  States body aches, vomiting, fatigue. Denies fever.  States for the past 2 days she has noted blood in her emesis.

## 2022-03-03 ENCOUNTER — Encounter: Payer: BC Managed Care – PPO | Admitting: Family Medicine

## 2022-03-03 DIAGNOSIS — Z3490 Encounter for supervision of normal pregnancy, unspecified, unspecified trimester: Secondary | ICD-10-CM | POA: Insufficient documentation

## 2022-07-01 ENCOUNTER — Other Ambulatory Visit: Payer: Self-pay

## 2022-07-01 ENCOUNTER — Encounter: Payer: Self-pay | Admitting: Obstetrics and Gynecology

## 2022-07-01 ENCOUNTER — Observation Stay
Admission: EM | Admit: 2022-07-01 | Discharge: 2022-07-01 | Disposition: A | Discharge status: Home / Self Care | Payer: Medicaid Other | Attending: Certified Nurse Midwife | Admitting: Certified Nurse Midwife

## 2022-07-01 DIAGNOSIS — O26893 Other specified pregnancy related conditions, third trimester: Secondary | ICD-10-CM

## 2022-07-01 DIAGNOSIS — Z91040 Latex allergy status: Secondary | ICD-10-CM | POA: Diagnosis not present

## 2022-07-01 DIAGNOSIS — O4703 False labor before 37 completed weeks of gestation, third trimester: Secondary | ICD-10-CM | POA: Diagnosis present

## 2022-07-01 DIAGNOSIS — Z79899 Other long term (current) drug therapy: Secondary | ICD-10-CM | POA: Insufficient documentation

## 2022-07-01 DIAGNOSIS — Z3A29 29 weeks gestation of pregnancy: Secondary | ICD-10-CM | POA: Diagnosis not present

## 2022-07-01 DIAGNOSIS — Z87891 Personal history of nicotine dependence: Secondary | ICD-10-CM | POA: Diagnosis not present

## 2022-07-01 DIAGNOSIS — R109 Unspecified abdominal pain: Secondary | ICD-10-CM

## 2022-07-01 LAB — WET PREP, GENITAL
Clue Cells Wet Prep HPF POC: NONE SEEN
Sperm: NONE SEEN
Trich, Wet Prep: NONE SEEN
WBC, Wet Prep HPF POC: <10 (ref <10)
Yeast Wet Prep HPF POC: NONE SEEN

## 2022-07-01 LAB — TYPE AND SCREEN
ABO/RH(D): O POS
Antibody Screen: NEGATIVE

## 2022-07-01 LAB — URINALYSIS, ROUTINE W REFLEX MICROSCOPIC
Bilirubin Urine: NEGATIVE
Glucose, UA: NEGATIVE mg/dL
Hgb urine dipstick: NEGATIVE
Ketones, ur: NEGATIVE mg/dL
Leukocytes,Ua: NEGATIVE
Nitrite: NEGATIVE
Protein, ur: 30 mg/dL — AB
Specific Gravity, Urine: 1.023 (ref 1.005–1.030)
pH: 6 (ref 5.0–8.0)

## 2022-07-01 LAB — ABO/RH: ABO/RH(D): O POS

## 2022-07-01 MED ORDER — ACETAMINOPHEN 500 MG PO TABS
1000.0000 mg | ORAL_TABLET | Freq: Four times a day (QID) | ORAL | Status: DC | PRN
  Administered 2022-07-01: 1000 mg via ORAL
  Filled 2022-07-01: qty 2

## 2022-07-01 NOTE — OB Triage Note (Signed)
Patient is a G1P0 at 59w5dwho presents to unit c/o lower abdominal pain that suddenly woke her up at 0530. Reports not feeling baby move since then. Denies vaginal bleeding and LOF. Patient does report a clear, "jelly-like" discharge since yesterday. Denies sexual intercourse. External monitors applied and assessing. Initial FHT 145. Vital signs WDL. [facility]

## 2022-07-01 NOTE — OB Triage Note (Signed)
Discharge instructions, labor precautions, and follow-up care reviewed with patient and mother of patient. All questions answered. Patient verbalized understanding. Discharged ambulatory off unit.

## 2022-07-01 NOTE — Final Progress Note (Signed)
OB/Triage Note  Patient ID: Vanessa Hester MRN: [id number] DOB/AGE: [birth year] 27 y.o.  Subjective  History of Present Illness: The patient is a 27 y.o. female G1P0000 at 7w5dwho presents for abdomen pain. Reports contraction pain starting at 0500 that is 10/10. Patient arrived to triage around 07:30, pain subsided about 8am. Patient was able to nap from 8-11am. Was able to sleep and reports pain resolved spontaneously. Denies LOF, VB, ongoing painful contractions. Reports good fetal movement. Next ROB is 07/08/22.  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     No current facility-administered medications on file prior to encounter.   Current Outpatient Medications on File Prior to Encounter  Medication Sig Dispense Refill   Prenatal Vit-Fe Fumarate-FA (PRENATAL MULTIVITAMIN) TABS tablet Take 1 tablet by mouth daily at 12 noon.     sertraline (ZOLOFT) 100 MG tablet Take 150 mg by mouth daily.     acetaminophen (TYLENOL) 325 MG tablet Take 650 mg by mouth every 6 (six) hours as needed for mild pain.     azithromycin (ZITHROMAX) 250 MG tablet Take 1 tablet (250 mg total) by mouth daily. Take first 2 tablets together, then 1 every day until finished. 6 tablet 0   cefpodoxime (VANTIN) 200 MG tablet Take 1 tablet (200 mg total) by mouth 2 (two) times daily. After you finish this abx start trimpex 26 tablet 0   docusate sodium (COLACE) 100 MG capsule Take 1 capsule (100 mg  total) by mouth 2 (two) times daily. 10 capsule 0   ergocalciferol (VITAMIN D2) 1.25 MG (50000 UT) capsule Take by mouth.     escitalopram (LEXAPRO) 10 MG tablet Take 10 mg by mouth daily.     ondansetron (ZOFRAN) 4 MG tablet Take 1 tablet (4 mg total) by mouth every 8 (eight) hours as needed for vomiting or nausea. 15 tablet 0   oxybutynin (DITROPAN-XL) 10 MG 24 hr tablet Take 1 tablet (10 mg total) by mouth daily. 30 tablet 1   predniSONE (DELTASONE) 20 MG tablet Take 2 tablets (40 mg total) by mouth daily. 10 tablet 0   promethazine (PHENERGAN) 25 MG tablet Take 1 tablet (25 mg total) by mouth every 6 (six) hours as needed for nausea or vomiting. 30 tablet 2   tamsulosin (FLOMAX) 0.4 MG CAPS capsule Take 1 capsule (0.4 mg total) by mouth daily after supper. 30 capsule 0   traZODone (DESYREL) 50 MG tablet Take 1 tablet (50 mg total) by mouth at bedtime. 30 tablet 3    Allergies  Allergen Reactions   Amoxicillin Anaphylaxis   Other Shortness Of Breath    Medication is "Durahistamine" used for coughing when she was a child   Benadryl [Diphenhydramine] Hives   Clindamycin/Lincomycin Nausea And Vomiting    Scratchy throat   Gardasil 9 [Hpv 9-Valent Recomb Vaccine]     Unknown reaction  Latex Rash    Social History   Socioeconomic History   Marital status: Single    Spouse name: Not on file   Number of children: Not on file   Years of education: Not on file   Highest education level: Not on file  Occupational History   Not on file  Tobacco Use   Smoking status: Former    Types: E-cigarettes, Cigarettes    Quit date: 01/07/2017    Years since quitting: 5.4   Smokeless tobacco: Never   Tobacco comments:    1 pack would last 2-3 weeks  Vaping Use   Vaping Use: Every day   Substances: Nicotine, Flavoring  Substance and Sexual Activity   Alcohol use: Yes    Comment: 1-2 x per month mixed drinks or wine   Drug use: Yes    Types: Marijuana, MDMA (Ecstacy)   Sexual activity:  Yes    Birth control/protection: Condom  Other Topics Concern   Not on file  Social History Narrative   Not on file   Social Determinants of Health   Financial Resource Strain: Not on file  Food Insecurity: Not on file  Transportation Needs: Not on file  Physical Activity: Not on file  Stress: Not on file  Social Connections: Not on file  Intimate Partner Violence: Not on file    Family History  Problem Relation Age of Onset   Migraines Father    Hypertension Father    Cancer Maternal Grandmother 10       colon and uterine   Cancer Maternal Grandfather        prostate   Arthritis Paternal Grandmother    Diabetes Paternal Grandmother    Asthma Paternal Grandfather    Depression Mother    Cancer Maternal Aunt 40       breast   Diabetes Maternal Uncle    Mental illness Paternal Aunt      ROS    Objective  Physical Exam: BP 129/61 (BP Location: Left Arm)   Pulse (!) 104   Temp 98.6 F (37 C) (Oral)   Resp 18   Ht 5' 5"$  (1.651 m)   Wt 126.1 kg   LMP  (LMP Unknown)   BMI 46.26 kg/m   OBGyn Exam  FHT 135, mod variability, pos accels, no decels Toco no contractions detected  SVE deferred  Significant Findings/ Diagnostic Studies: Wet prep and UA neg   Hospital Course: The patient was admitted to [facility] Triage for observation. Pain quickly subsided upon admission and she was able to nap for several hours. By 11am denied ongoing pain or any other issues. No contractions were detected by the toco or through palpation. Safe to d/c home, PTL precautions discussed. Has ROB on 07/08/22.  Assessment: 27 y.o. female G1P0000 at [redacted]w[redacted]d  Plan: Discharge home Return for any s/sx of preterm labor Keep ROB appt on 07/08/22  Discharge Instructions     Discharge activity:  No Restrictions   Complete by: As directed    Discharge diet:  No restrictions   Complete by: As directed    Notify physician for a general feeling that "something is not right"   Complete by: As  directed    Notify physician for increase or change in vaginal discharge   Complete by: As directed    Notify physician for intestinal cramps, with or without diarrhea, sometimes described as "gas pain"   Complete by: As directed    Notify physician for leaking of fluid  Complete by: As directed    Notify physician for low, dull backache, unrelieved by heat or Tylenol   Complete by: As directed    Notify physician for menstrual like cramps   Complete by: As directed    Notify physician for pelvic pressure   Complete by: As directed    Notify physician for uterine contractions.  These may be painless and feel like the uterus is tightening or the baby is  "balling up"   Complete by: As directed    Notify physician for vaginal bleeding   Complete by: As directed    PRETERM LABOR:  Includes any of the follwing symptoms that occur between 20 - [redacted] weeks gestation.  If these symptoms are not stopped, preterm labor can result in preterm delivery, placing your baby at risk   Complete by: As directed       Allergies as of 07/01/2022       Reactions   Amoxicillin Anaphylaxis   Other Shortness Of Breath   Medication is "Durahistamine" used for coughing when she was a child   Benadryl [diphenhydramine] Hives   Clindamycin/lincomycin Nausea And Vomiting   Scratchy throat   Gardasil 9 [hpv 9-valent Recomb Vaccine]    Unknown reaction   Latex Rash        Medication List     TAKE these medications    acetaminophen 325 MG tablet Commonly known as: TYLENOL Take 650 mg by mouth every 6 (six) hours as needed for mild pain.   azithromycin 250 MG tablet Commonly known as: ZITHROMAX Take 1 tablet (250 mg total) by mouth daily. Take first 2 tablets together, then 1 every day until finished.   cefpodoxime 200 MG tablet Commonly known as: VANTIN Take 1 tablet (200 mg total) by mouth 2 (two) times daily. After you finish this abx start trimpex   docusate sodium 100 MG capsule Commonly  known as: COLACE Take 1 capsule (100 mg total) by mouth 2 (two) times daily.   ergocalciferol 1.25 MG (50000 UT) capsule Commonly known as: VITAMIN D2 Take by mouth.   escitalopram 10 MG tablet Commonly known as: LEXAPRO Take 10 mg by mouth daily.   ondansetron 4 MG tablet Commonly known as: Zofran Take 1 tablet (4 mg total) by mouth every 8 (eight) hours as needed for vomiting or nausea.   oxybutynin 10 MG 24 hr tablet Commonly known as: DITROPAN-XL Take 1 tablet (10 mg total) by mouth daily.   predniSONE 20 MG tablet Commonly known as: DELTASONE Take 2 tablets (40 mg total) by mouth daily.   prenatal multivitamin Tabs tablet Take 1 tablet by mouth daily at 12 noon.   promethazine 25 MG tablet Commonly known as: PHENERGAN Take 1 tablet (25 mg total) by mouth every 6 (six) hours as needed for nausea or vomiting.   sertraline 100 MG tablet Commonly known as: ZOLOFT Take 150 mg by mouth daily.   tamsulosin 0.4 MG Caps capsule Commonly known as: Flomax Take 1 capsule (0.4 mg total) by mouth daily after supper.   traZODone 50 MG tablet Commonly known as: DESYREL Take 1 tablet (50 mg total) by mouth at bedtime.         Total time spent taking care of this patient: 60 minutes  Signed: Maggie Font CNM, FNP 07/01/2022, 10:52 AM

## 2022-08-09 ENCOUNTER — Other Ambulatory Visit: Payer: Self-pay

## 2022-08-09 ENCOUNTER — Emergency Department
Admission: EM | Admit: 2022-08-09 | Discharge: 2022-08-09 | Disposition: A | Discharge status: Home / Self Care | Payer: Medicaid Other | Attending: Emergency Medicine | Admitting: Emergency Medicine

## 2022-08-09 DIAGNOSIS — G932 Benign intracranial hypertension: Secondary | ICD-10-CM | POA: Insufficient documentation

## 2022-08-09 DIAGNOSIS — R519 Headache, unspecified: Secondary | ICD-10-CM

## 2022-08-09 DIAGNOSIS — R600 Localized edema: Secondary | ICD-10-CM | POA: Insufficient documentation

## 2022-08-09 DIAGNOSIS — O9973 Diseases of the skin and subcutaneous tissue complicating the puerperium: Secondary | ICD-10-CM | POA: Diagnosis not present

## 2022-08-09 DIAGNOSIS — O99893 Other specified diseases and conditions complicating puerperium: Secondary | ICD-10-CM | POA: Diagnosis present

## 2022-08-09 DIAGNOSIS — B372 Candidiasis of skin and nail: Secondary | ICD-10-CM | POA: Insufficient documentation

## 2022-08-09 DIAGNOSIS — O99355 Diseases of the nervous system complicating the puerperium: Secondary | ICD-10-CM | POA: Diagnosis not present

## 2022-08-09 DIAGNOSIS — L304 Erythema intertrigo: Secondary | ICD-10-CM | POA: Insufficient documentation

## 2022-08-09 LAB — URINALYSIS, ROUTINE W REFLEX MICROSCOPIC
Bacteria, UA: NONE SEEN
Bilirubin Urine: NEGATIVE
Glucose, UA: NEGATIVE mg/dL
Ketones, ur: NEGATIVE mg/dL
Nitrite: NEGATIVE
Protein, ur: NEGATIVE mg/dL
Specific Gravity, Urine: 1.015 (ref 1.005–1.030)
pH: 6 (ref 5.0–8.0)

## 2022-08-09 LAB — CBC
HCT: 33.5 % — ABNORMAL LOW (ref 36.0–46.0)
Hemoglobin: 10.2 g/dL — ABNORMAL LOW (ref 12.0–15.0)
MCH: 25.6 pg — ABNORMAL LOW (ref 26.0–34.0)
MCHC: 30.4 g/dL (ref 30.0–36.0)
MCV: 84.2 fL (ref 80.0–100.0)
Platelets: 397 10*3/uL (ref 150–400)
RBC: 3.98 MIL/uL (ref 3.87–5.11)
RDW: 15.2 % (ref 11.5–15.5)
WBC: 9.2 10*3/uL (ref 4.0–10.5)
nRBC: 0 % (ref 0.0–0.2)

## 2022-08-09 LAB — BASIC METABOLIC PANEL
Anion gap: 7 (ref 5–15)
BUN: 19 mg/dL (ref 6–20)
CO2: 23 mmol/L (ref 22–32)
Calcium: 8.8 mg/dL — ABNORMAL LOW (ref 8.9–10.3)
Chloride: 109 mmol/L (ref 98–111)
Creatinine, Ser: 0.89 mg/dL (ref 0.44–1.00)
GFR, Estimated: >60 mL/min (ref >60)
Glucose, Bld: 88 mg/dL (ref 70–99)
Potassium: 4.6 mmol/L (ref 3.5–5.1)
Sodium: 139 mmol/L (ref 135–145)

## 2022-08-09 LAB — HEPATIC FUNCTION PANEL
ALT: 14 U/L (ref 0–44)
AST: 17 U/L (ref 15–41)
Albumin: 3.1 g/dL — ABNORMAL LOW (ref 3.5–5.0)
Alkaline Phosphatase: 109 U/L (ref 38–126)
Bilirubin, Direct: <0.1 mg/dL (ref 0.0–0.2)
Total Bilirubin: 0.2 mg/dL — ABNORMAL LOW (ref 0.3–1.2)
Total Protein: 6.7 g/dL (ref 6.5–8.1)

## 2022-08-09 LAB — PROTEIN / CREATININE RATIO, URINE
Creatinine, Urine: 76 mg/dL
Protein Creatinine Ratio: 0.09 mg/mg{Cre} (ref 0.00–0.15)
Total Protein, Urine: 7 mg/dL

## 2022-08-09 MED ORDER — SODIUM CHLORIDE 0.9 % IV BOLUS: 500.0000 mL | Freq: Once | INTRAVENOUS | Status: DC

## 2022-08-09 MED ORDER — CLOTRIMAZOLE 1 % EX CREA: 1.0000 | TOPICAL_CREAM | Freq: Two times a day (BID) | CUTANEOUS | 0 refills | Status: AC

## 2022-08-09 MED ORDER — KETOROLAC TROMETHAMINE 15 MG/ML IJ SOLN
15.0000 mg | Freq: Once | INTRAMUSCULAR | Status: AC
  Administered 2022-08-09: 15 mg via INTRAVENOUS
  Filled 2022-08-09: qty 1

## 2022-08-09 MED ORDER — METOCLOPRAMIDE HCL 5 MG/ML IJ SOLN
10.0000 mg | Freq: Once | INTRAMUSCULAR | Status: AC
  Administered 2022-08-09: 10 mg via INTRAVENOUS
  Filled 2022-08-09: qty 2

## 2022-08-09 NOTE — ED Triage Notes (Signed)
Pt to ED for HTN, headache for past 2 days. C section on 3/14, induced d/t preeclampsia.

## 2022-08-09 NOTE — Discharge Instructions (Addendum)
Follow-up with your neurologist and ophthalmologist as planned.  Return to the ER for new, worsening, or persistent severe headache, vision changes, swelling, weakness or lightheadedness, or any other new or worsening symptoms that concern you.  Use the clotrimazole cream for the area of redness on your lower abdomen around the cesarean incision.  This is likely due to a fungal infection.

## 2022-08-09 NOTE — ED Provider Notes (Signed)
[facility] Provider Note    Event Date/Time   First MD Initiated Contact with Patient 08/09/22 1350     (approximate)   History   Hypertension   HPI  Vanessa Hester is a 27 y.o. female G2P1 status post recent cesarean delivery on 3/14 secondary to breech presentation as well as a history of preeclampsia and idiopathic intracranial hypertension who presents with headache, swelling, and concern for recurrent preeclampsia.  The patient states that she was doing well after delivery but then started having a headache 2 days ago.  It is persistent course, bilateral, throbbing, and mainly located behind her eyes.  It is not associated with nausea.  It feels similar to headaches that she was having several weeks ago when she was in the hospital and was diagnosed with IIH.  She is having some blurred vision in her left eye which she had experienced previously, and also reports mild blurriness in the right eye which is new.  The patient also reports swelling to both legs over the last few days as well as some generalized bodyaches.  She is also concerned for some redness around her cesarean section wound.  The patient has not checked her blood pressure at home in the last several days.  I reviewed the past medical records.  The patient was admitted to [facility] earlier this month.  She presented on 07/1331 weeks 4 days for preeclampsia with severe features and underwent cesarean delivery on 3/14 which was uncomplicated.  During the postpartum period she remained normotensive with no symptoms of preeclampsia.  [facility] previously due to persistent headaches.  She was having vision changes at that time.  She has a prior history of neurosyphilis although this was ruled out.  Per the notes, headaches were thought to be due to [place] and she had therapeutic lumbar punctures twice.   Physical Exam   Triage Vital Signs: ED Triage  Vitals  Enc Vitals Group     BP 08/09/22 1324 127/71     Pulse Rate 08/09/22 1324 88     Resp 08/09/22 1324 20     Temp 08/09/22 1327 97.9 F (36.6 C)     Temp src --      SpO2 08/09/22 1324 95 %     Weight --      Height 08/09/22 1325 5\' 5"  (1.651 m)     Head Circumference --      Peak Flow --      Pain Score 08/09/22 1324 8     Pain Loc --      Pain Edu? --      Excl. in [place]? --     Most recent vital signs: Vitals:   08/09/22 1500 08/09/22 1530  BP: 111/73 (!) 103/54  Pulse: 89 87  Resp:    Temp:    SpO2: 99% 100%     General: Alert and oriented, relatively well-appearing. CV:  Good peripheral perfusion.  Resp:  Normal effort.  Abd:  No distention.  Other:  1+ bilateral lower extremity edema.  Cesarean scar clean, dry, and intact.  Surrounding skin underneath the patient's pannus with some patchy erythematous raised areas consistent with intertrigo.  EOMI.  PERRLA.  No photophobia.  No facial droop.  Cranial nerves III through XII grossly intact.  Motor and sensory intact in all extremities.  No pronator drift.  No ataxia on finger-to-nose.   ED Results /  Procedures / Treatments   Labs (all labs ordered are listed, but only abnormal results are displayed) Labs Reviewed  CBC - Abnormal; Notable for the following components:      Result Value   Hemoglobin 10.2 (*)    HCT 33.5 (*)    MCH 25.6 (*)    All other components within normal limits  BASIC METABOLIC PANEL - Abnormal; Notable for the following components:   Calcium 8.8 (*)    All other components within normal limits  HEPATIC FUNCTION PANEL - Abnormal; Notable for the following components:   Albumin 3.1 (*)    Total Bilirubin 0.2 (*)    All other components within normal limits  URINALYSIS, ROUTINE W REFLEX MICROSCOPIC - Abnormal; Notable for the following components:   Color, Urine YELLOW (*)    APPearance HAZY (*)    Hgb urine dipstick LARGE (*)    Leukocytes,Ua SMALL (*)    All other components  within normal limits  PROTEIN / CREATININE RATIO, URINE     EKG   RADIOLOGY   PROCEDURES:  Critical Care performed: No  Procedures   MEDICATIONS ORDERED IN ED: Medications  metoCLOPramide (REGLAN) injection 10 mg (10 mg Intravenous Given 08/09/22 1520)  ketorolac (TORADOL) 15 MG/ML injection 15 mg (15 mg Intravenous Given 08/09/22 1519)     IMPRESSION / MDM / ASSESSMENT AND PLAN / ED COURSE  I reviewed the triage vital signs and the nursing notes.  27 year old female with a history of preeclampsia and IIH presents with headache for the last 2 days similar to prior IIH as well as edema, after having given birth on 3/14.  On exam the vital signs are normal.  Blood pressure is normal at this time.  Neurologic exam is normal.  The patient reports mild visual symptoms although they are not significantly changed what she had experienced previously.  Cesarean scar appears intact with some mild surrounding intertrigo but no evidence of cellulitis.  Differential diagnosis includes, but is not limited to, postpartum preeclampsia, IIH, tension headache, migraine.  At this time my suspicion for preeclampsia is very low given the normal initial blood pressure.  We will monitor the patient for few hours, obtain additional blood pressure readings, lab workup, and treat symptomatically for the headache with Reglan and Toradol.  Patient's presentation is most consistent with acute presentation with potential threat to life or bodily function.  The patient is on the cardiac monitor to evaluate for evidence of arrhythmia and/or significant heart rate changes.  ----------------------------------------- 3:40 PM on 08/09/2022 -----------------------------------------  Timestamp.  Blood pressures have remained consistently in a normal range.  Lab workup is reassuring.  There is no protein on the urinalysis, creatinine is normal, platelets are normal, and LFTs are also normal.  The patient appears  well and reports improvement in her headache with the Reglan and Toradol.  I consulted and discussed the case with Dr. Quinn Axe from neurology who does not recommend any further emergent workup for the [facility] symptoms and advises that the patient is appropriate for outpatient follow-up with her neurologist and ophthalmologist.  At this time there is no clinical evidence of preeclampsia.  The patient is stable for discharge home.  I counseled her on the results of the workup and she appears reassured.  I gave strict return precautions and she expresses understanding.  FINAL CLINICAL IMPRESSION(S) / ED DIAGNOSES   Final diagnoses:  Nonintractable episodic headache, unspecified headache type  IIH (idiopathic intracranial hypertension)  [place] intertrigo  Rx / DC Orders   ED Discharge Orders          Ordered    clotrimazole (CLOTRIMAZOLE ANTI-FUNGAL) 1 % cream  2 times daily        08/09/22 1539             Note:  This document was prepared using Dragon voice recognition software and may include unintentional dictation errors.    Arta Silence, MD 08/09/22 1549

## 2022-08-09 NOTE — ED Notes (Signed)
Pt ambulatory at discharge.  

## 2022-08-09 NOTE — ED Notes (Signed)
RN attempted IV access x2 without success. IV team consult placed.  

## 2022-08-09 NOTE — ED Notes (Signed)
No additional orders at this time per Dr Cherylann Banas

## 2023-01-25 ENCOUNTER — Other Ambulatory Visit: Payer: Medicaid Other

## 2023-04-26 ENCOUNTER — Ambulatory Visit (INDEPENDENT_AMBULATORY_CARE_PROVIDER_SITE_OTHER): Payer: Medicaid Other

## 2023-04-26 ENCOUNTER — Ambulatory Visit (INDEPENDENT_AMBULATORY_CARE_PROVIDER_SITE_OTHER): Payer: Medicaid Other | Admitting: Podiatry

## 2023-04-26 ENCOUNTER — Encounter: Payer: Self-pay | Admitting: Podiatry

## 2023-04-26 DIAGNOSIS — M7752 Other enthesopathy of left foot: Secondary | ICD-10-CM | POA: Diagnosis not present

## 2023-04-26 DIAGNOSIS — B351 Tinea unguium: Secondary | ICD-10-CM | POA: Diagnosis not present

## 2023-04-26 DIAGNOSIS — M779 Enthesopathy, unspecified: Secondary | ICD-10-CM

## 2023-04-26 DIAGNOSIS — M722 Plantar fascial fibromatosis: Secondary | ICD-10-CM | POA: Diagnosis not present

## 2023-04-26 DIAGNOSIS — M7751 Other enthesopathy of right foot: Secondary | ICD-10-CM | POA: Diagnosis not present

## 2023-04-26 MED ORDER — TERBINAFINE HCL 250 MG PO TABS: 250.0000 mg | ORAL_TABLET | Freq: Every day | ORAL | 0 refills | Status: DC

## 2023-04-29 NOTE — Progress Notes (Signed)
   Chief Complaint  Patient presents with   Foot Pain    "My feet hurt." N - feet hurt L - posterior and plantar bilateral D - 1 year O - suddenly, gotten worse C - throbbing, sharp pains at times A - standing, shoes T - none   Nail Problem    "I have toenail fungus." N - fungus L - 1-5 bilateral D - 1.5 yrs O - slowly gotten worse C - ingrown, thick, discolored, painful hallux and pinkie toes A - standing T - prescription cream    Subjective: 27 y.o. female presenting today for above complaints.   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     Allergies  Allergen Reactions   Amoxicillin Anaphylaxis   Other Shortness Of Breath    Medication is "Durahistamine" used for coughing when she was a child   Benadryl [Diphenhydramine] Hives   Clindamycin/Lincomycin Nausea And Vomiting    Scratchy throat   Gardasil 9 [Hpv 9-Valent Recomb Vaccine]     Unknown reaction   Latex Rash     Objective: Physical Exam General: The patient is alert and oriented x3 in no acute distress.  Dermatology: Hyperkeratotic, discolored, thickened, onychodystrophy noted. Skin is warm, dry and supple bilateral lower extremities. Negative for open lesions or macerations.  Vascular: Palpable pedal pulses bilaterally. No edema or erythema noted. Capillary refill within normal limits.  Neurological: Grossly intact via light touch  Musculoskeletal Exam: No pedal deformity noted.   Generalized foot pain bilateral noted diffusely throughout the feet  Assessment: #1 Onychomycosis of toenails #2 bilateral generalized foot pain  Plan of Care:  -Patient was evaluated.  X-rays reviewed -Advised against going barefoot.  Recommend good supportive shoes and sneakers even around the house -Today we discussed different treatment options including oral, topical, and laser antifungal treatment modalities.  We discussed their efficacies and side effects.  Patient opts for oral antifungal treatment modality -Prescription for Lamisil 250 mg #90 daily. Pt denies a history of liver pathology or symptoms.  Patient is otherwise healthy -Return to clinic 6 months   Felecia Shelling, DPM [facility]  Dr. Felecia Shelling, DPM    [address] [place], [place] [address]                Office [phone]  Fax [phone]

## 2023-10-25 ENCOUNTER — Ambulatory Visit: Payer: Medicaid Other | Admitting: Podiatry

## 2023-10-25 ENCOUNTER — Encounter: Payer: Self-pay | Admitting: Podiatry

## 2023-10-25 DIAGNOSIS — B351 Tinea unguium: Secondary | ICD-10-CM | POA: Diagnosis not present

## 2023-10-25 MED ORDER — CICLOPIROX 8 % EX SOLN: Freq: Every day | CUTANEOUS | 2 refills | Status: AC

## 2023-10-25 MED ORDER — TERBINAFINE HCL 250 MG PO TABS: 250.0000 mg | ORAL_TABLET | Freq: Every day | ORAL | 0 refills | Status: DC

## 2023-10-25 NOTE — Progress Notes (Signed)
   Chief Complaint  Patient presents with   Nail Problem    Pt is here to f/u on bilateral toenail fungus, pt states she has not seen any difference to toenails, continues to take medication that was prescribed.    Subjective: 28 y.o. female presenting today for follow-up evaluation of onychomycosis of the toenails.  She has been taking oral Lamisil  as prescribed.  Tolerating well.  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     Allergies  Allergen Reactions   Amoxicillin Anaphylaxis   Other Shortness Of Breath    Medication is "Durahistamine" used for coughing when she was a child   Benadryl [Diphenhydramine] Hives   Clindamycin /Lincomycin Nausea And Vomiting    Scratchy throat   Gardasil 9 [Hpv 9-Valent Recomb Vaccine]     Unknown reaction   Latex Rash     Objective: Physical Exam General: The patient is alert and oriented x3 in no acute distress.  Dermatology: Unfortunately there is minimal change to the toenails.  Mild improvement.  Hyperkeratotic, discolored, thickened, onychodystrophy noted. Skin is warm, dry and supple bilateral lower extremities. Negative for open lesions or macerations.  Vascular: Palpable pedal pulses bilaterally. No edema or erythema noted. Capillary refill within normal limits.  Neurological: Grossly intact via light touch  Musculoskeletal Exam: No pedal deformity noted.   Generalized foot pain bilateral noted diffusely throughout the feet  Assessment: #1 Onychomycosis of toenails/fungal nail infection  Plan of Care:  -Patient was evaluated.   - Refill prescription for Lamisil  250 mg #90 daily.  CMP 09/21/2023 hepatic function WNL -Due to the minimal improvement of the nail plates I did take a nail biopsy to the left hallux to send the pathology for fungal culture -Also prescription for topical Penlac solution.  Apply daily -Return to clinic 6 months   Dot Gazella, DPM [facility]  Dr. Dot Gazella, DPM    [address] [place], [place] [address]                Office [phone]  Fax [phone]

## 2023-10-25 NOTE — Addendum Note (Signed)
 Addended by: Geroldine Kotyk on: 10/25/2023 12:18 PM   Modules accepted: Orders

## 2023-11-21 LAB — CULT, FUNGUS, SKIN,HAIR,NAIL W/KOH
MICRO NUMBER:: 16577688
SPECIMEN QUALITY:: ADEQUATE

## 2024-02-03 ENCOUNTER — Other Ambulatory Visit

## 2024-04-24 ENCOUNTER — Ambulatory Visit: Admitting: Podiatry

## 2024-04-25 ENCOUNTER — Telehealth: Payer: Self-pay | Admitting: Podiatry

## 2024-04-25 NOTE — Telephone Encounter (Signed)
 Marked as No Show in [facility]- Note on door Office Closed Called the number listed which was ours and was no recording to the delay opening  Per Shelly: The message was on the phones till 10am yesterday. The appointment for yesterday was at 1015. Our offices were open then and that is why no message would have been on the phones

## 2024-05-08 ENCOUNTER — Ambulatory Visit: Admitting: Podiatry

## 2024-05-29 ENCOUNTER — Encounter: Payer: Self-pay | Admitting: Podiatry

## 2024-05-29 ENCOUNTER — Ambulatory Visit (INDEPENDENT_AMBULATORY_CARE_PROVIDER_SITE_OTHER): Admitting: Podiatry

## 2024-05-29 ENCOUNTER — Ambulatory Visit (INDEPENDENT_AMBULATORY_CARE_PROVIDER_SITE_OTHER)

## 2024-05-29 VITALS — Ht 65.0 in | Wt 278.0 lb

## 2024-05-29 DIAGNOSIS — M722 Plantar fascial fibromatosis: Secondary | ICD-10-CM | POA: Diagnosis not present

## 2024-05-29 DIAGNOSIS — M79674 Pain in right toe(s): Secondary | ICD-10-CM

## 2024-05-29 DIAGNOSIS — Z79899 Other long term (current) drug therapy: Secondary | ICD-10-CM | POA: Diagnosis not present

## 2024-05-29 DIAGNOSIS — M79675 Pain in left toe(s): Secondary | ICD-10-CM

## 2024-05-29 DIAGNOSIS — B351 Tinea unguium: Secondary | ICD-10-CM | POA: Diagnosis not present

## 2024-05-29 DIAGNOSIS — M65971 Unspecified synovitis and tenosynovitis, right ankle and foot: Secondary | ICD-10-CM

## 2024-05-29 MED ORDER — CLOTRIMAZOLE-BETAMETHASONE 1-0.05 % EX CREA: 1.0000 | TOPICAL_CREAM | Freq: Every day | CUTANEOUS | 2 refills | Status: AC

## 2024-05-29 MED ORDER — BETAMETHASONE SOD PHOS & ACET 6 (3-3) MG/ML IJ SUSP
3.0000 mg | Freq: Once | INTRAMUSCULAR | Status: AC
  Administered 2024-05-29: 3 mg via INTRA_ARTICULAR

## 2024-05-29 MED ORDER — TERBINAFINE HCL 250 MG PO TABS: 250.0000 mg | ORAL_TABLET | Freq: Every day | ORAL | 0 refills | Status: AC

## 2024-05-29 NOTE — Progress Notes (Signed)
" ° °  No chief complaint on file.   Subjective: 29 y.o. female presenting today for follow-up evaluation of onychomycosis of the toenails.    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     Allergies  Allergen Reactions   Amoxicillin Anaphylaxis   Other Shortness Of Breath    Medication is Durahistamine used for coughing when she was a child   Benadryl [Diphenhydramine] Hives   Clindamycin /Lincomycin Nausea And Vomiting    Scratchy throat   Gardasil 9 [Hpv 9-Valent Recomb Vaccine]     Unknown reaction   Latex Rash    LT foot 05/29/2024  RT foot 05/29/2024  Objective: Physical Exam General: The patient is alert and oriented x3 in no acute distress.  Dermatology: Unfortunately there is minimal change to the toenails.  Mild improvement.  Hyperkeratotic, discolored, thickened, onychodystrophy noted. Skin is warm, dry and supple bilateral lower extremities. Negative for open lesions or macerations.  Vascular: Palpable pedal pulses bilaterally. No edema or erythema noted. Capillary refill within normal limits.  Neurological: Grossly intact via light touch  Musculoskeletal Exam: No pedal deformity noted.  Generalized foot pain bilateral noted diffusely throughout the feet and today is more isolated to the dorsal lateral aspect of the right foot  Assessment: #1 Onychomycosis of toenails/fungal nail infection #2 synovitis right foot  Plan of Care:  -Patient was evaluated.    -Injection of 0.5 cc Celestone  Soluspan injected around the right midfoot  -Mechanical debridement of the nails 1-5 bilateral was performed using a nail nipper without incident or bleeding - Refill prescription for Lamisil  250 mg #90 daily.  At the end of her 90-day supplies she may request a refill but we will need to get an updated hepatic function panel prior to refill -Order placed for updated hepatic function panel.  Discontinue if abnormal -Prescription for Lotrisone  cream apply twice daily -Return to clinic 6 months  Thresa EMERSON Sar, DPM [facility]  Dr. Thresa EMERSON Sar, DPM    [address] [place], [place] [address]                Office [phone]  Fax [phone]  "

## 2024-05-30 LAB — HEPATIC FUNCTION PANEL
ALT: 9 IU/L (ref 0–32)
AST: 19 IU/L (ref 0–40)
Albumin: 4.2 g/dL (ref 4.0–5.0)
Alkaline Phosphatase: 89 IU/L (ref 41–116)
Bilirubin Total: <0.2 mg/dL (ref 0.0–1.2)
Bilirubin, Direct: <0.08 mg/dL (ref 0.00–0.40)
Total Protein: 7 g/dL (ref 6.0–8.5)

## 2024-11-27 ENCOUNTER — Ambulatory Visit: Admitting: Podiatry
